# Patient Record
Sex: Female | Born: 1935
Health system: Southern US, Community
[De-identification: ages and names within clinical notes are randomized; demographics above are authoritative.]

## PROBLEM LIST (undated history)

## (undated) DIAGNOSIS — R55 Syncope and collapse: Secondary | ICD-10-CM

## (undated) DIAGNOSIS — E785 Hyperlipidemia, unspecified: Secondary | ICD-10-CM

## (undated) DIAGNOSIS — R42 Dizziness and giddiness: Secondary | ICD-10-CM

## (undated) DIAGNOSIS — M810 Age-related osteoporosis without current pathological fracture: Secondary | ICD-10-CM

## (undated) DIAGNOSIS — Z853 Personal history of malignant neoplasm of breast: Secondary | ICD-10-CM

## (undated) HISTORY — DX: Dizziness and giddiness: R42

## (undated) HISTORY — DX: Age-related osteoporosis without current pathological fracture: M81.0

## (undated) HISTORY — DX: Hyperlipidemia, unspecified: E78.5

## (undated) HISTORY — DX: Dizziness and giddiness: R55

## (undated) HISTORY — PX: TONSILLECTOMY: SUR1361

## (undated) HISTORY — PX: TUBAL LIGATION: SHX77

---

## 2004-07-20 ENCOUNTER — Ambulatory Visit: Payer: Self-pay

## 2005-08-16 ENCOUNTER — Ambulatory Visit: Payer: Self-pay

## 2006-09-19 ENCOUNTER — Ambulatory Visit: Payer: Self-pay | Admitting: Internal Medicine

## 2006-10-03 ENCOUNTER — Ambulatory Visit: Payer: Self-pay | Admitting: Internal Medicine

## 2007-07-23 ENCOUNTER — Encounter: Admission: RE | Admit: 2007-07-23 | Discharge: 2007-07-23 | Payer: Self-pay | Admitting: Internal Medicine

## 2007-07-26 ENCOUNTER — Ambulatory Visit: Payer: Self-pay | Admitting: Surgery

## 2008-03-24 ENCOUNTER — Emergency Department (HOSPITAL_COMMUNITY): Admission: EM | Admit: 2008-03-24 | Discharge: 2008-03-24 | Payer: Self-pay | Admitting: Emergency Medicine

## 2008-07-04 ENCOUNTER — Encounter: Admission: RE | Admit: 2008-07-04 | Discharge: 2008-07-04 | Payer: Self-pay | Admitting: Internal Medicine

## 2009-07-06 ENCOUNTER — Encounter: Admission: RE | Admit: 2009-07-06 | Discharge: 2009-07-06 | Payer: Self-pay | Admitting: Internal Medicine

## 2010-06-08 ENCOUNTER — Other Ambulatory Visit: Payer: Self-pay | Admitting: Internal Medicine

## 2010-06-08 DIAGNOSIS — Z1231 Encounter for screening mammogram for malignant neoplasm of breast: Secondary | ICD-10-CM

## 2010-06-08 LAB — DIFFERENTIAL
Eosinophils Absolute: 0 10*3/uL (ref 0.0–0.7)
Eosinophils Relative: 0 % (ref 0–5)
Monocytes Relative: 6 % (ref 3–12)
Neutro Abs: 10.2 10*3/uL — ABNORMAL HIGH (ref 1.7–7.7)

## 2010-06-08 LAB — CBC
RBC: 4.77 MIL/uL (ref 3.87–5.11)
RDW: 12.4 % (ref 11.5–15.5)
WBC: 12.1 10*3/uL — ABNORMAL HIGH (ref 4.0–10.5)

## 2010-06-08 LAB — BASIC METABOLIC PANEL
CO2: 25 mEq/L (ref 19–32)
Chloride: 106 mEq/L (ref 96–112)
GFR calc non Af Amer: 59 mL/min — ABNORMAL LOW (ref >60)
Glucose, Bld: 141 mg/dL — ABNORMAL HIGH (ref 70–99)
Sodium: 140 mEq/L (ref 135–145)

## 2010-07-08 ENCOUNTER — Ambulatory Visit
Admission: RE | Admit: 2010-07-08 | Discharge: 2010-07-08 | Disposition: A | Discharge status: Home / Self Care | Payer: Medicare Other | Source: Ambulatory Visit | Attending: Internal Medicine | Admitting: Internal Medicine

## 2010-07-08 DIAGNOSIS — Z1231 Encounter for screening mammogram for malignant neoplasm of breast: Secondary | ICD-10-CM

## 2011-05-30 ENCOUNTER — Other Ambulatory Visit: Payer: Self-pay | Admitting: Internal Medicine

## 2011-05-30 DIAGNOSIS — Z1231 Encounter for screening mammogram for malignant neoplasm of breast: Secondary | ICD-10-CM

## 2011-06-13 DIAGNOSIS — J069 Acute upper respiratory infection, unspecified: Secondary | ICD-10-CM | POA: Diagnosis not present

## 2011-07-11 ENCOUNTER — Ambulatory Visit
Admission: RE | Admit: 2011-07-11 | Discharge: 2011-07-11 | Disposition: A | Discharge status: Home / Self Care | Payer: Medicare Other | Source: Ambulatory Visit | Attending: Internal Medicine | Admitting: Internal Medicine

## 2011-07-11 DIAGNOSIS — Z1231 Encounter for screening mammogram for malignant neoplasm of breast: Secondary | ICD-10-CM

## 2011-07-27 DIAGNOSIS — B009 Herpesviral infection, unspecified: Secondary | ICD-10-CM | POA: Diagnosis not present

## 2011-07-27 DIAGNOSIS — L821 Other seborrheic keratosis: Secondary | ICD-10-CM | POA: Diagnosis not present

## 2011-08-11 DIAGNOSIS — E785 Hyperlipidemia, unspecified: Secondary | ICD-10-CM | POA: Diagnosis not present

## 2011-08-11 DIAGNOSIS — M1990 Unspecified osteoarthritis, unspecified site: Secondary | ICD-10-CM | POA: Diagnosis not present

## 2011-08-11 DIAGNOSIS — R7309 Other abnormal glucose: Secondary | ICD-10-CM | POA: Diagnosis not present

## 2011-08-11 DIAGNOSIS — R030 Elevated blood-pressure reading, without diagnosis of hypertension: Secondary | ICD-10-CM | POA: Diagnosis not present

## 2011-12-13 DIAGNOSIS — L568 Other specified acute skin changes due to ultraviolet radiation: Secondary | ICD-10-CM | POA: Diagnosis not present

## 2011-12-13 DIAGNOSIS — R209 Unspecified disturbances of skin sensation: Secondary | ICD-10-CM | POA: Diagnosis not present

## 2012-01-27 DIAGNOSIS — M899 Disorder of bone, unspecified: Secondary | ICD-10-CM | POA: Diagnosis not present

## 2012-01-27 DIAGNOSIS — R82998 Other abnormal findings in urine: Secondary | ICD-10-CM | POA: Diagnosis not present

## 2012-01-27 DIAGNOSIS — R7309 Other abnormal glucose: Secondary | ICD-10-CM | POA: Diagnosis not present

## 2012-01-27 DIAGNOSIS — E785 Hyperlipidemia, unspecified: Secondary | ICD-10-CM | POA: Diagnosis not present

## 2012-02-03 DIAGNOSIS — E785 Hyperlipidemia, unspecified: Secondary | ICD-10-CM | POA: Diagnosis not present

## 2012-02-03 DIAGNOSIS — M899 Disorder of bone, unspecified: Secondary | ICD-10-CM | POA: Diagnosis not present

## 2012-02-03 DIAGNOSIS — Z0000 Encounter for general adult medical examination without abnormal findings: Secondary | ICD-10-CM | POA: Diagnosis not present

## 2012-02-03 DIAGNOSIS — Z1212 Encounter for screening for malignant neoplasm of rectum: Secondary | ICD-10-CM | POA: Diagnosis not present

## 2012-02-03 DIAGNOSIS — M949 Disorder of cartilage, unspecified: Secondary | ICD-10-CM | POA: Diagnosis not present

## 2012-02-03 DIAGNOSIS — R7309 Other abnormal glucose: Secondary | ICD-10-CM | POA: Diagnosis not present

## 2012-05-15 DIAGNOSIS — M899 Disorder of bone, unspecified: Secondary | ICD-10-CM | POA: Diagnosis not present

## 2012-05-15 DIAGNOSIS — M949 Disorder of cartilage, unspecified: Secondary | ICD-10-CM | POA: Diagnosis not present

## 2012-06-12 ENCOUNTER — Other Ambulatory Visit: Payer: Self-pay

## 2012-06-12 DIAGNOSIS — Z1231 Encounter for screening mammogram for malignant neoplasm of breast: Secondary | ICD-10-CM

## 2012-06-21 DIAGNOSIS — M810 Age-related osteoporosis without current pathological fracture: Secondary | ICD-10-CM | POA: Diagnosis not present

## 2012-07-11 ENCOUNTER — Ambulatory Visit
Admission: RE | Admit: 2012-07-11 | Discharge: 2012-07-11 | Disposition: A | Discharge status: Home / Self Care | Payer: Medicare Other | Source: Ambulatory Visit

## 2012-07-11 DIAGNOSIS — Z1231 Encounter for screening mammogram for malignant neoplasm of breast: Secondary | ICD-10-CM

## 2012-07-26 ENCOUNTER — Other Ambulatory Visit: Payer: Self-pay | Admitting: Family Medicine

## 2012-07-27 ENCOUNTER — Ambulatory Visit
Admission: RE | Admit: 2012-07-27 | Discharge: 2012-07-27 | Disposition: A | Discharge status: Home / Self Care | Payer: No Typology Code available for payment source | Source: Ambulatory Visit | Attending: Family Medicine | Admitting: Family Medicine

## 2012-07-27 ENCOUNTER — Other Ambulatory Visit: Payer: Self-pay | Admitting: Family Medicine

## 2012-07-27 DIAGNOSIS — Z006 Encounter for examination for normal comparison and control in clinical research program: Secondary | ICD-10-CM

## 2012-08-06 DIAGNOSIS — R030 Elevated blood-pressure reading, without diagnosis of hypertension: Secondary | ICD-10-CM | POA: Diagnosis not present

## 2012-08-06 DIAGNOSIS — M810 Age-related osteoporosis without current pathological fracture: Secondary | ICD-10-CM | POA: Diagnosis not present

## 2012-08-06 DIAGNOSIS — E785 Hyperlipidemia, unspecified: Secondary | ICD-10-CM | POA: Diagnosis not present

## 2012-08-06 DIAGNOSIS — Z6825 Body mass index (BMI) 25.0-25.9, adult: Secondary | ICD-10-CM | POA: Diagnosis not present

## 2012-08-06 DIAGNOSIS — M1990 Unspecified osteoarthritis, unspecified site: Secondary | ICD-10-CM | POA: Diagnosis not present

## 2013-02-19 DIAGNOSIS — R7309 Other abnormal glucose: Secondary | ICD-10-CM | POA: Diagnosis not present

## 2013-02-19 DIAGNOSIS — E785 Hyperlipidemia, unspecified: Secondary | ICD-10-CM | POA: Diagnosis not present

## 2013-02-19 DIAGNOSIS — M810 Age-related osteoporosis without current pathological fracture: Secondary | ICD-10-CM | POA: Diagnosis not present

## 2013-02-27 DIAGNOSIS — Z1212 Encounter for screening for malignant neoplasm of rectum: Secondary | ICD-10-CM | POA: Diagnosis not present

## 2013-03-01 DIAGNOSIS — R030 Elevated blood-pressure reading, without diagnosis of hypertension: Secondary | ICD-10-CM | POA: Diagnosis not present

## 2013-03-01 DIAGNOSIS — M1990 Unspecified osteoarthritis, unspecified site: Secondary | ICD-10-CM | POA: Diagnosis not present

## 2013-03-01 DIAGNOSIS — R209 Unspecified disturbances of skin sensation: Secondary | ICD-10-CM | POA: Diagnosis not present

## 2013-03-01 DIAGNOSIS — E785 Hyperlipidemia, unspecified: Secondary | ICD-10-CM | POA: Diagnosis not present

## 2013-03-01 DIAGNOSIS — Z0000 Encounter for general adult medical examination without abnormal findings: Secondary | ICD-10-CM | POA: Diagnosis not present

## 2013-03-01 DIAGNOSIS — Z1331 Encounter for screening for depression: Secondary | ICD-10-CM | POA: Diagnosis not present

## 2013-03-01 DIAGNOSIS — M810 Age-related osteoporosis without current pathological fracture: Secondary | ICD-10-CM | POA: Diagnosis not present

## 2013-03-01 DIAGNOSIS — R7309 Other abnormal glucose: Secondary | ICD-10-CM | POA: Diagnosis not present

## 2013-06-04 ENCOUNTER — Other Ambulatory Visit: Payer: Self-pay

## 2013-06-04 DIAGNOSIS — Z1231 Encounter for screening mammogram for malignant neoplasm of breast: Secondary | ICD-10-CM

## 2013-07-25 ENCOUNTER — Encounter (INDEPENDENT_AMBULATORY_CARE_PROVIDER_SITE_OTHER): Payer: Self-pay

## 2013-07-25 ENCOUNTER — Ambulatory Visit
Admission: RE | Admit: 2013-07-25 | Discharge: 2013-07-25 | Disposition: A | Discharge status: Home / Self Care | Payer: Medicare Other | Source: Ambulatory Visit

## 2013-07-25 DIAGNOSIS — Z1231 Encounter for screening mammogram for malignant neoplasm of breast: Secondary | ICD-10-CM | POA: Diagnosis not present

## 2013-08-20 ENCOUNTER — Ambulatory Visit
Admission: RE | Admit: 2013-08-20 | Discharge: 2013-08-20 | Disposition: A | Discharge status: Home / Self Care | Payer: Self-pay | Source: Ambulatory Visit | Attending: Family Medicine | Admitting: Family Medicine

## 2013-08-20 ENCOUNTER — Other Ambulatory Visit: Payer: Self-pay | Admitting: Family Medicine

## 2013-08-20 DIAGNOSIS — Z006 Encounter for examination for normal comparison and control in clinical research program: Secondary | ICD-10-CM

## 2014-02-24 DIAGNOSIS — M810 Age-related osteoporosis without current pathological fracture: Secondary | ICD-10-CM | POA: Diagnosis not present

## 2014-02-24 DIAGNOSIS — R739 Hyperglycemia, unspecified: Secondary | ICD-10-CM | POA: Diagnosis not present

## 2014-02-24 DIAGNOSIS — R8299 Other abnormal findings in urine: Secondary | ICD-10-CM | POA: Diagnosis not present

## 2014-02-24 DIAGNOSIS — Z008 Encounter for other general examination: Secondary | ICD-10-CM | POA: Diagnosis not present

## 2014-02-24 DIAGNOSIS — E785 Hyperlipidemia, unspecified: Secondary | ICD-10-CM | POA: Diagnosis not present

## 2014-03-03 DIAGNOSIS — R739 Hyperglycemia, unspecified: Secondary | ICD-10-CM | POA: Diagnosis not present

## 2014-03-03 DIAGNOSIS — Z1389 Encounter for screening for other disorder: Secondary | ICD-10-CM | POA: Diagnosis not present

## 2014-03-03 DIAGNOSIS — E785 Hyperlipidemia, unspecified: Secondary | ICD-10-CM | POA: Diagnosis not present

## 2014-03-03 DIAGNOSIS — Z0000 Encounter for general adult medical examination without abnormal findings: Secondary | ICD-10-CM | POA: Diagnosis not present

## 2014-03-03 DIAGNOSIS — M1990 Unspecified osteoarthritis, unspecified site: Secondary | ICD-10-CM | POA: Diagnosis not present

## 2014-03-03 DIAGNOSIS — R030 Elevated blood-pressure reading, without diagnosis of hypertension: Secondary | ICD-10-CM | POA: Diagnosis not present

## 2014-03-03 DIAGNOSIS — M810 Age-related osteoporosis without current pathological fracture: Secondary | ICD-10-CM | POA: Diagnosis not present

## 2014-03-03 DIAGNOSIS — Z6825 Body mass index (BMI) 25.0-25.9, adult: Secondary | ICD-10-CM | POA: Diagnosis not present

## 2014-03-04 DIAGNOSIS — Z1212 Encounter for screening for malignant neoplasm of rectum: Secondary | ICD-10-CM | POA: Diagnosis not present

## 2014-06-23 ENCOUNTER — Other Ambulatory Visit: Payer: Self-pay

## 2014-06-23 DIAGNOSIS — Z1231 Encounter for screening mammogram for malignant neoplasm of breast: Secondary | ICD-10-CM

## 2014-08-04 ENCOUNTER — Ambulatory Visit
Admission: RE | Admit: 2014-08-04 | Discharge: 2014-08-04 | Disposition: A | Discharge status: Home / Self Care | Payer: Medicare Other | Source: Ambulatory Visit

## 2014-08-04 DIAGNOSIS — Z1231 Encounter for screening mammogram for malignant neoplasm of breast: Secondary | ICD-10-CM | POA: Diagnosis not present

## 2014-08-05 ENCOUNTER — Other Ambulatory Visit: Payer: Self-pay | Admitting: Internal Medicine

## 2014-08-05 DIAGNOSIS — M810 Age-related osteoporosis without current pathological fracture: Secondary | ICD-10-CM

## 2014-08-12 ENCOUNTER — Ambulatory Visit
Admission: RE | Admit: 2014-08-12 | Discharge: 2014-08-12 | Disposition: A | Discharge status: Home / Self Care | Payer: No Typology Code available for payment source | Source: Ambulatory Visit | Attending: Internal Medicine | Admitting: Internal Medicine

## 2014-08-12 DIAGNOSIS — M810 Age-related osteoporosis without current pathological fracture: Secondary | ICD-10-CM

## 2015-03-10 DIAGNOSIS — M810 Age-related osteoporosis without current pathological fracture: Secondary | ICD-10-CM | POA: Diagnosis not present

## 2015-03-10 DIAGNOSIS — R739 Hyperglycemia, unspecified: Secondary | ICD-10-CM | POA: Diagnosis not present

## 2015-03-10 DIAGNOSIS — E784 Other hyperlipidemia: Secondary | ICD-10-CM | POA: Diagnosis not present

## 2015-03-10 DIAGNOSIS — R030 Elevated blood-pressure reading, without diagnosis of hypertension: Secondary | ICD-10-CM | POA: Diagnosis not present

## 2015-03-10 DIAGNOSIS — R8290 Unspecified abnormal findings in urine: Secondary | ICD-10-CM | POA: Diagnosis not present

## 2015-03-10 DIAGNOSIS — N390 Urinary tract infection, site not specified: Secondary | ICD-10-CM | POA: Diagnosis not present

## 2015-03-17 DIAGNOSIS — M1990 Unspecified osteoarthritis, unspecified site: Secondary | ICD-10-CM | POA: Diagnosis not present

## 2015-03-17 DIAGNOSIS — M810 Age-related osteoporosis without current pathological fracture: Secondary | ICD-10-CM | POA: Diagnosis not present

## 2015-03-17 DIAGNOSIS — R030 Elevated blood-pressure reading, without diagnosis of hypertension: Secondary | ICD-10-CM | POA: Diagnosis not present

## 2015-03-17 DIAGNOSIS — Z0000 Encounter for general adult medical examination without abnormal findings: Secondary | ICD-10-CM | POA: Diagnosis not present

## 2015-03-17 DIAGNOSIS — R739 Hyperglycemia, unspecified: Secondary | ICD-10-CM | POA: Diagnosis not present

## 2015-03-17 DIAGNOSIS — E784 Other hyperlipidemia: Secondary | ICD-10-CM | POA: Diagnosis not present

## 2015-03-17 DIAGNOSIS — Z6825 Body mass index (BMI) 25.0-25.9, adult: Secondary | ICD-10-CM | POA: Diagnosis not present

## 2015-03-17 DIAGNOSIS — Z1389 Encounter for screening for other disorder: Secondary | ICD-10-CM | POA: Diagnosis not present

## 2015-03-18 DIAGNOSIS — Z1212 Encounter for screening for malignant neoplasm of rectum: Secondary | ICD-10-CM | POA: Diagnosis not present

## 2015-03-20 DIAGNOSIS — M810 Age-related osteoporosis without current pathological fracture: Secondary | ICD-10-CM | POA: Diagnosis not present

## 2015-05-06 DIAGNOSIS — M810 Age-related osteoporosis without current pathological fracture: Secondary | ICD-10-CM | POA: Diagnosis not present

## 2015-05-06 DIAGNOSIS — Z6825 Body mass index (BMI) 25.0-25.9, adult: Secondary | ICD-10-CM | POA: Diagnosis not present

## 2015-05-15 ENCOUNTER — Encounter (HOSPITAL_COMMUNITY): Payer: No Typology Code available for payment source

## 2015-05-15 ENCOUNTER — Ambulatory Visit (HOSPITAL_COMMUNITY)
Admission: RE | Admit: 2015-05-15 | Discharge: 2015-05-15 | Disposition: A | Discharge status: Home / Self Care | Payer: Medicare Other | Source: Ambulatory Visit | Attending: Internal Medicine | Admitting: Internal Medicine

## 2015-05-15 DIAGNOSIS — M810 Age-related osteoporosis without current pathological fracture: Secondary | ICD-10-CM | POA: Diagnosis not present

## 2015-05-15 MED ORDER — DENOSUMAB 60 MG/ML ~~LOC~~ SOLN
60.0000 mg | Freq: Once | SUBCUTANEOUS | Status: AC
  Administered 2015-05-15: 60 mg via SUBCUTANEOUS
  Filled 2015-05-15: qty 1

## 2015-05-15 NOTE — Discharge Instructions (Signed)
Denosumab injection  What is this medicine?  DENOSUMAB (den oh sue mab) slows bone breakdown. Prolia is used to treat osteoporosis in women after menopause and in men. Xgeva is used to prevent bone fractures and other bone problems caused by cancer bone metastases. Xgeva is also used to treat giant cell tumor of the bone.  This medicine may be used for other purposes; ask your health care provider or pharmacist if you have questions.  What should I tell my health care provider before I take this medicine?  They need to know if you have any of these conditions:  -dental disease  -eczema  -infection or history of infections  -kidney disease or on dialysis  -low blood calcium or vitamin D  -malabsorption syndrome  -scheduled to have surgery or tooth extraction  -taking medicine that contains denosumab  -thyroid or parathyroid disease  -an unusual reaction to denosumab, other medicines, foods, dyes, or preservatives  -pregnant or trying to get pregnant  -breast-feeding  How should I use this medicine?  This medicine is for injection under the skin. It is given by a health care professional in a hospital or clinic setting.  If you are getting Prolia, a special MedGuide will be given to you by the pharmacist with each prescription and refill. Be sure to read this information carefully each time.  For Prolia, talk to your pediatrician regarding the use of this medicine in children. Special care may be needed. For Xgeva, talk to your pediatrician regarding the use of this medicine in children. While this drug may be prescribed for children as young as 13 years for selected conditions, precautions do apply.  Overdosage: If you think you have taken too much of this medicine contact a poison control center or emergency room at once.  NOTE: This medicine is only for you. Do not share this medicine with others.  What if I miss a dose?  It is important not to miss your dose. Call your doctor or health care professional if you are  unable to keep an appointment.  What may interact with this medicine?  Do not take this medicine with any of the following medications:  -other medicines containing denosumab  This medicine may also interact with the following medications:  -medicines that suppress the immune system  -medicines that treat cancer  -steroid medicines like prednisone or cortisone  This list may not describe all possible interactions. Give your health care provider a list of all the medicines, herbs, non-prescription drugs, or dietary supplements you use. Also tell them if you smoke, drink alcohol, or use illegal drugs. Some items may interact with your medicine.  What should I watch for while using this medicine?  Visit your doctor or health care professional for regular checks on your progress. Your doctor or health care professional may order blood tests and other tests to see how you are doing.  Call your doctor or health care professional if you get a cold or other infection while receiving this medicine. Do not treat yourself. This medicine may decrease your body's ability to fight infection.  You should make sure you get enough calcium and vitamin D while you are taking this medicine, unless your doctor tells you not to. Discuss the foods you eat and the vitamins you take with your health care professional.  See your dentist regularly. Brush and floss your teeth as directed. Before you have any dental work done, tell your dentist you are receiving this medicine.  Do   not become pregnant while taking this medicine or for 5 months after stopping it. Women should inform their doctor if they wish to become pregnant or think they might be pregnant. There is a potential for serious side effects to an unborn child. Talk to your health care professional or pharmacist for more information.  What side effects may I notice from receiving this medicine?  Side effects that you should report to your doctor or health care professional as soon as  possible:  -allergic reactions like skin rash, itching or hives, swelling of the face, lips, or tongue  -breathing problems  -chest pain  -fast, irregular heartbeat  -feeling faint or lightheaded, falls  -fever, chills, or any other sign of infection  -muscle spasms, tightening, or twitches  -numbness or tingling  -skin blisters or bumps, or is dry, peels, or red  -slow healing or unexplained pain in the mouth or jaw  -unusual bleeding or bruising  Side effects that usually do not require medical attention (Report these to your doctor or health care professional if they continue or are bothersome.):  -muscle pain  -stomach upset, gas  This list may not describe all possible side effects. Call your doctor for medical advice about side effects. You may report side effects to FDA at 1-800-FDA-1088.  Where should I keep my medicine?  This medicine is only given in a clinic, doctor's office, or other health care setting and will not be stored at home.  NOTE: This sheet is a summary. It may not cover all possible information. If you have questions about this medicine, talk to your doctor, pharmacist, or health care provider.      2016, [employer] Standard. (2011-08-08 12:37:47)

## 2015-06-25 ENCOUNTER — Other Ambulatory Visit: Payer: Self-pay

## 2015-06-25 DIAGNOSIS — Z1231 Encounter for screening mammogram for malignant neoplasm of breast: Secondary | ICD-10-CM

## 2015-08-06 ENCOUNTER — Ambulatory Visit
Admission: RE | Admit: 2015-08-06 | Discharge: 2015-08-06 | Disposition: A | Discharge status: Home / Self Care | Payer: Medicare Other | Source: Ambulatory Visit

## 2015-08-06 DIAGNOSIS — Z1231 Encounter for screening mammogram for malignant neoplasm of breast: Secondary | ICD-10-CM

## 2015-08-07 ENCOUNTER — Other Ambulatory Visit: Payer: Self-pay | Admitting: Internal Medicine

## 2015-08-07 DIAGNOSIS — R928 Other abnormal and inconclusive findings on diagnostic imaging of breast: Secondary | ICD-10-CM

## 2015-08-20 ENCOUNTER — Ambulatory Visit
Admission: RE | Admit: 2015-08-20 | Discharge: 2015-08-20 | Disposition: A | Discharge status: Home / Self Care | Payer: Medicare Other | Source: Ambulatory Visit | Attending: Internal Medicine | Admitting: Internal Medicine

## 2015-08-20 DIAGNOSIS — R928 Other abnormal and inconclusive findings on diagnostic imaging of breast: Secondary | ICD-10-CM

## 2016-03-15 DIAGNOSIS — R7309 Other abnormal glucose: Secondary | ICD-10-CM | POA: Diagnosis not present

## 2016-03-15 DIAGNOSIS — E784 Other hyperlipidemia: Secondary | ICD-10-CM | POA: Diagnosis not present

## 2016-03-15 DIAGNOSIS — R8299 Other abnormal findings in urine: Secondary | ICD-10-CM | POA: Diagnosis not present

## 2016-03-15 DIAGNOSIS — M810 Age-related osteoporosis without current pathological fracture: Secondary | ICD-10-CM | POA: Diagnosis not present

## 2016-03-22 DIAGNOSIS — M810 Age-related osteoporosis without current pathological fracture: Secondary | ICD-10-CM | POA: Diagnosis not present

## 2016-03-22 DIAGNOSIS — Z1389 Encounter for screening for other disorder: Secondary | ICD-10-CM | POA: Diagnosis not present

## 2016-03-22 DIAGNOSIS — M1990 Unspecified osteoarthritis, unspecified site: Secondary | ICD-10-CM | POA: Diagnosis not present

## 2016-03-22 DIAGNOSIS — R7309 Other abnormal glucose: Secondary | ICD-10-CM | POA: Diagnosis not present

## 2016-03-22 DIAGNOSIS — Z0000 Encounter for general adult medical examination without abnormal findings: Secondary | ICD-10-CM | POA: Diagnosis not present

## 2016-03-22 DIAGNOSIS — R208 Other disturbances of skin sensation: Secondary | ICD-10-CM | POA: Diagnosis not present

## 2016-03-22 DIAGNOSIS — R030 Elevated blood-pressure reading, without diagnosis of hypertension: Secondary | ICD-10-CM | POA: Diagnosis not present

## 2016-03-22 DIAGNOSIS — Z6824 Body mass index (BMI) 24.0-24.9, adult: Secondary | ICD-10-CM | POA: Diagnosis not present

## 2016-03-22 DIAGNOSIS — E784 Other hyperlipidemia: Secondary | ICD-10-CM | POA: Diagnosis not present

## 2016-04-15 DIAGNOSIS — Z1212 Encounter for screening for malignant neoplasm of rectum: Secondary | ICD-10-CM | POA: Diagnosis not present

## 2016-06-27 ENCOUNTER — Other Ambulatory Visit (HOSPITAL_COMMUNITY): Payer: Self-pay | Admitting: *Deleted

## 2016-06-28 ENCOUNTER — Ambulatory Visit (HOSPITAL_COMMUNITY)
Admission: RE | Admit: 2016-06-28 | Discharge: 2016-06-28 | Disposition: A | Discharge status: Home / Self Care | Payer: Medicare Other | Source: Ambulatory Visit | Attending: Internal Medicine | Admitting: Internal Medicine

## 2016-06-28 DIAGNOSIS — M810 Age-related osteoporosis without current pathological fracture: Secondary | ICD-10-CM | POA: Diagnosis not present

## 2016-06-28 MED ORDER — DENOSUMAB 60 MG/ML ~~LOC~~ SOLN
60.0000 mg | Freq: Once | SUBCUTANEOUS | Status: AC
  Administered 2016-06-28: 60 mg via SUBCUTANEOUS
  Filled 2016-06-28: qty 1

## 2016-07-11 ENCOUNTER — Other Ambulatory Visit: Payer: Self-pay | Admitting: Internal Medicine

## 2016-07-11 DIAGNOSIS — Z1231 Encounter for screening mammogram for malignant neoplasm of breast: Secondary | ICD-10-CM

## 2016-08-22 ENCOUNTER — Ambulatory Visit: Payer: Medicare Other

## 2016-09-05 ENCOUNTER — Ambulatory Visit
Admission: RE | Admit: 2016-09-05 | Discharge: 2016-09-05 | Disposition: A | Discharge status: Home / Self Care | Payer: Medicare Other | Source: Ambulatory Visit | Attending: Internal Medicine | Admitting: Internal Medicine

## 2016-09-05 DIAGNOSIS — Z1231 Encounter for screening mammogram for malignant neoplasm of breast: Secondary | ICD-10-CM | POA: Diagnosis not present

## 2016-11-04 ENCOUNTER — Encounter: Payer: Self-pay | Admitting: Gastroenterology

## 2016-12-14 ENCOUNTER — Ambulatory Visit (AMBULATORY_SURGERY_CENTER): Payer: Self-pay

## 2016-12-14 VITALS — Ht 61.5 in | Wt 135.0 lb

## 2016-12-14 DIAGNOSIS — Z1211 Encounter for screening for malignant neoplasm of colon: Secondary | ICD-10-CM

## 2016-12-14 MED ORDER — SUPREP BOWEL PREP KIT 17.5-3.13-1.6 GM/177ML PO SOLN
1.0000 | Freq: Once | ORAL | 0 refills | Status: AC
Start: 2016-12-14 — End: 2016-12-14

## 2016-12-14 NOTE — Progress Notes (Signed)
No allergies to eggs or soy No past problems with anesthesia No diet meds No home oxygen  Declined emmi 

## 2016-12-30 ENCOUNTER — Encounter: Payer: Medicare Other | Admitting: Gastroenterology

## 2017-01-05 ENCOUNTER — Other Ambulatory Visit: Payer: Self-pay

## 2017-01-05 ENCOUNTER — Ambulatory Visit (AMBULATORY_SURGERY_CENTER): Payer: Medicare Other | Admitting: Gastroenterology

## 2017-01-05 ENCOUNTER — Encounter: Payer: Self-pay | Admitting: Gastroenterology

## 2017-01-05 VITALS — BP 114/62 | HR 79 | Temp 97.5°F | Resp 20 | Ht 61.5 in | Wt 135.0 lb

## 2017-01-05 DIAGNOSIS — D123 Benign neoplasm of transverse colon: Secondary | ICD-10-CM

## 2017-01-05 DIAGNOSIS — D120 Benign neoplasm of cecum: Secondary | ICD-10-CM

## 2017-01-05 DIAGNOSIS — Z86010 Personal history of colonic polyps: Secondary | ICD-10-CM | POA: Diagnosis not present

## 2017-01-05 DIAGNOSIS — Z1211 Encounter for screening for malignant neoplasm of colon: Secondary | ICD-10-CM | POA: Diagnosis not present

## 2017-01-05 MED ORDER — SODIUM CHLORIDE 0.9 % IV SOLN: 500.0000 mL | INTRAVENOUS | Status: DC

## 2017-01-05 NOTE — Progress Notes (Signed)
Pt's states no medical or surgical changes since previsit or office visit. 

## 2017-01-05 NOTE — Progress Notes (Signed)
Report given to PACU, vss 

## 2017-01-05 NOTE — Patient Instructions (Signed)
YOU HAD AN ENDOSCOPIC PROCEDURE TODAY AT THE [facility]:   Refer to the procedure report that was given to you for any specific questions about what was found during the examination.  If the procedure report does not answer your questions, please call your gastroenterologist to clarify.  If you requested that your care partner not be given the details of your procedure findings, then the procedure report has been included in a sealed envelope for you to review at your convenience later.  YOU SHOULD EXPECT: Some feelings of bloating in the abdomen. Passage of more gas than usual.  Walking can help get rid of the air that was put into your GI tract during the procedure and reduce the bloating. If you had a lower endoscopy (such as a colonoscopy or flexible sigmoidoscopy) you may notice spotting of blood in your stool or on the toilet paper. If you underwent a bowel prep for your procedure, you may not have a normal bowel movement for a few days.  Please Note:  You might notice some irritation and congestion in your nose or some drainage.  This is from the oxygen used during your procedure.  There is no need for concern and it should clear up in a day or so.  SYMPTOMS TO REPORT IMMEDIATELY:   Following lower endoscopy (colonoscopy or flexible sigmoidoscopy):  Excessive amounts of blood in the stool  Significant tenderness or worsening of abdominal pains  Swelling of the abdomen that is new, acute  Fever of 100F or higher  For urgent or emergent issues, a gastroenterologist can be reached at any hour by calling [phone].   DIET:  We do recommend a small meal at first, but then you may proceed to your regular diet.  Drink plenty of fluids but you should avoid alcoholic beverages for 24 hours.  ACTIVITY:  You should plan to take it easy for the rest of today and you should NOT DRIVE or use heavy machinery until tomorrow (because of the sedation medicines used during the test).     FOLLOW UP: Our staff will call the number listed on your records the next business day following your procedure to check on you and address any questions or concerns that you may have regarding the information given to you following your procedure. If we do not reach you, we will leave a message.  However, if you are feeling well and you are not experiencing any problems, there is no need to return our call.  We will assume that you have returned to your regular daily activities without incident.  If any biopsies were taken you will be contacted by phone or by letter within the next 1-3 weeks.  Please call us at [phone] if you have not heard about the biopsies in 3 weeks.   Await for biopsy results to determine next repeat Colonoscopy screening Polyps (handout given) Diverticulosis (handout given)   SIGNATURES/CONFIDENTIALITY: You and/or your care partner have signed paperwork which will be entered into your electronic medical record.  These signatures attest to the fact that that the information above on your After Visit Summary has been reviewed and is understood.  Full responsibility of the confidentiality of this discharge information lies with you and/or your care-partner. 

## 2017-01-05 NOTE — Progress Notes (Signed)
Called to room to assist during endoscopic procedure.  Patient ID and intended procedure confirmed with present staff. Received instructions for my participation in the procedure from the performing physician.  

## 2017-01-05 NOTE — Op Note (Signed)
[facility] Patient Name: Holly Leon Procedure Date: 01/05/2017 8:52 AM MRN: [id number] Endoscopist: Remo Lipps P. Armbruster MD, MD Age: 81 Referring MD:  Date of Birth: 1935-07-28 Gender: Female Account #: [id number] Procedure:                Colonoscopy Indications:              Screening for colorectal malignant neoplasm Medicines:                Monitored Anesthesia Care Procedure:                Pre-Anesthesia Assessment:                           - Prior to the procedure, a History and Physical                            was performed, and patient medications and                            allergies were reviewed. The patient's tolerance of                            previous anesthesia was also reviewed. The risks                            and benefits of the procedure and the sedation                            options and risks were discussed with the patient.                            All questions were answered, and informed consent                            was obtained. Prior Anticoagulants: The patient has                            taken no previous anticoagulant or antiplatelet                            agents. ASA Grade Assessment: I - A normal, healthy                            patient. After reviewing the risks and benefits,                            the patient was deemed in satisfactory condition to                            undergo the procedure.                           After obtaining informed consent, the colonoscope  was passed under direct vision. Throughout the                            procedure, the patient's blood pressure, pulse, and                            oxygen saturations were monitored continuously. The                            Model PCF-[id number]) scope was introduced                            through the anus and advanced to the the cecum,                            identified by  appendiceal orifice and ileocecal                            valve. The colonoscopy was performed without                            difficulty. The patient tolerated the procedure                            well. The quality of the bowel preparation was                            good. The ileocecal valve, appendiceal orifice, and                            rectum were photographed. Scope In: 9:01:19 AM Scope Out: 9:19:03 AM Scope Withdrawal Time: 0 hours 14 minutes 43 seconds  Total Procedure Duration: 0 hours 17 minutes 44 seconds  Findings:                 The perianal and digital rectal examinations were                            normal.                           Two sessile polyps were found in the cecum. The                            polyps were 3 to 4 mm in size. These polyps were                            removed with a cold snare. Resection and retrieval                            were complete.                           A 3 mm polyp was found in the transverse colon. The  polyp was sessile. The polyp was removed with a                            cold snare. Resection and retrieval were complete.                           A few medium-mouthed diverticula were found in the                            sigmoid colon.                           The exam was otherwise without abnormality. Complications:            No immediate complications. Estimated blood loss:                            Minimal. Estimated Blood Loss:     Estimated blood loss was minimal. Impression:               - Two 3 to 4 mm polyps in the cecum, removed with a                            cold snare. Resected and retrieved.                           - One 3 mm polyp in the transverse colon, removed                            with a cold snare. Resected and retrieved.                           - Diverticulosis in the sigmoid colon.                           - The examination was  otherwise normal. Recommendation:           - Patient has a contact number available for                            emergencies. The signs and symptoms of potential                            delayed complications were discussed with the                            patient. Return to normal activities tomorrow.                            Written discharge instructions were provided to the                            patient.                           - Resume  previous diet.                           - Continue present medications.                           - Await pathology results with further                            recommendations. Remo Lipps P. Armbruster MD, MD 01/05/2017 9:22:27 AM This report has been signed electronically.

## 2017-01-06 ENCOUNTER — Telehealth: Payer: Self-pay | Admitting: *Deleted

## 2017-01-06 NOTE — Telephone Encounter (Signed)
  Follow up Call-  Call back number 01/05/2017  Post procedure Call Back phone  # [phone]  Permission to leave phone message Yes  Some recent data might be hidden     Patient questions:  Do you have a fever, pain , or abdominal swelling? No. Pain Score  0 *  Have you tolerated food without any problems? Yes.    Have you been able to return to your normal activities? No.  Do you have any questions about your discharge instructions: Diet   No. Medications  No. Follow up visit  No.  Do you have questions or concerns about your Care? No.  Actions: * If pain score is 4 or above: No action needed, pain <4.

## 2017-01-11 ENCOUNTER — Encounter: Payer: Self-pay | Admitting: Gastroenterology

## 2017-03-20 DIAGNOSIS — M810 Age-related osteoporosis without current pathological fracture: Secondary | ICD-10-CM | POA: Diagnosis not present

## 2017-03-20 DIAGNOSIS — R82998 Other abnormal findings in urine: Secondary | ICD-10-CM | POA: Diagnosis not present

## 2017-03-20 DIAGNOSIS — E7849 Other hyperlipidemia: Secondary | ICD-10-CM | POA: Diagnosis not present

## 2017-03-27 DIAGNOSIS — Z1212 Encounter for screening for malignant neoplasm of rectum: Secondary | ICD-10-CM | POA: Diagnosis not present

## 2017-03-27 DIAGNOSIS — R7309 Other abnormal glucose: Secondary | ICD-10-CM | POA: Diagnosis not present

## 2017-03-27 DIAGNOSIS — Z1389 Encounter for screening for other disorder: Secondary | ICD-10-CM | POA: Diagnosis not present

## 2017-03-27 DIAGNOSIS — Z0000 Encounter for general adult medical examination without abnormal findings: Secondary | ICD-10-CM | POA: Diagnosis not present

## 2017-03-27 DIAGNOSIS — E7849 Other hyperlipidemia: Secondary | ICD-10-CM | POA: Diagnosis not present

## 2017-03-27 DIAGNOSIS — Z6824 Body mass index (BMI) 24.0-24.9, adult: Secondary | ICD-10-CM | POA: Diagnosis not present

## 2017-03-27 DIAGNOSIS — M4302 Spondylolysis, cervical region: Secondary | ICD-10-CM | POA: Diagnosis not present

## 2017-03-27 DIAGNOSIS — M542 Cervicalgia: Secondary | ICD-10-CM | POA: Diagnosis not present

## 2017-03-27 DIAGNOSIS — R030 Elevated blood-pressure reading, without diagnosis of hypertension: Secondary | ICD-10-CM | POA: Diagnosis not present

## 2017-03-27 DIAGNOSIS — M810 Age-related osteoporosis without current pathological fracture: Secondary | ICD-10-CM | POA: Diagnosis not present

## 2017-04-07 ENCOUNTER — Other Ambulatory Visit (HOSPITAL_COMMUNITY): Payer: Self-pay

## 2017-04-10 ENCOUNTER — Ambulatory Visit (HOSPITAL_COMMUNITY)
Admission: RE | Admit: 2017-04-10 | Discharge: 2017-04-10 | Disposition: A | Discharge status: Home / Self Care | Payer: Medicare Other | Source: Ambulatory Visit | Attending: Internal Medicine | Admitting: Internal Medicine

## 2017-04-10 DIAGNOSIS — M810 Age-related osteoporosis without current pathological fracture: Secondary | ICD-10-CM | POA: Diagnosis not present

## 2017-04-10 MED ORDER — DENOSUMAB 60 MG/ML ~~LOC~~ SOLN
60.0000 mg | Freq: Once | SUBCUTANEOUS | Status: AC
  Administered 2017-04-10: 60 mg via SUBCUTANEOUS
  Filled 2017-04-10: qty 1

## 2017-04-11 ENCOUNTER — Encounter (HOSPITAL_COMMUNITY): Payer: Medicare Other

## 2017-07-20 DIAGNOSIS — M79662 Pain in left lower leg: Secondary | ICD-10-CM | POA: Diagnosis not present

## 2017-07-20 DIAGNOSIS — M25562 Pain in left knee: Secondary | ICD-10-CM | POA: Diagnosis not present

## 2017-07-20 DIAGNOSIS — R590 Localized enlarged lymph nodes: Secondary | ICD-10-CM | POA: Diagnosis not present

## 2017-07-20 DIAGNOSIS — M25462 Effusion, left knee: Secondary | ICD-10-CM | POA: Diagnosis not present

## 2017-07-31 ENCOUNTER — Other Ambulatory Visit: Payer: Self-pay | Admitting: Internal Medicine

## 2017-07-31 DIAGNOSIS — Z1231 Encounter for screening mammogram for malignant neoplasm of breast: Secondary | ICD-10-CM

## 2017-09-07 ENCOUNTER — Ambulatory Visit
Admission: RE | Admit: 2017-09-07 | Discharge: 2017-09-07 | Disposition: A | Discharge status: Home / Self Care | Payer: Medicare Other | Source: Ambulatory Visit | Attending: Internal Medicine | Admitting: Internal Medicine

## 2017-09-07 DIAGNOSIS — Z1231 Encounter for screening mammogram for malignant neoplasm of breast: Secondary | ICD-10-CM

## 2017-09-25 DIAGNOSIS — M810 Age-related osteoporosis without current pathological fracture: Secondary | ICD-10-CM | POA: Diagnosis not present

## 2017-09-25 DIAGNOSIS — E7849 Other hyperlipidemia: Secondary | ICD-10-CM | POA: Diagnosis not present

## 2018-03-26 DIAGNOSIS — E7849 Other hyperlipidemia: Secondary | ICD-10-CM | POA: Diagnosis not present

## 2018-03-26 DIAGNOSIS — R7309 Other abnormal glucose: Secondary | ICD-10-CM | POA: Diagnosis not present

## 2018-03-26 DIAGNOSIS — R82998 Other abnormal findings in urine: Secondary | ICD-10-CM | POA: Diagnosis not present

## 2018-03-26 DIAGNOSIS — M810 Age-related osteoporosis without current pathological fracture: Secondary | ICD-10-CM | POA: Diagnosis not present

## 2018-04-02 DIAGNOSIS — Z1339 Encounter for screening examination for other mental health and behavioral disorders: Secondary | ICD-10-CM | POA: Diagnosis not present

## 2018-04-02 DIAGNOSIS — R030 Elevated blood-pressure reading, without diagnosis of hypertension: Secondary | ICD-10-CM | POA: Diagnosis not present

## 2018-04-02 DIAGNOSIS — Z0000 Encounter for general adult medical examination without abnormal findings: Secondary | ICD-10-CM | POA: Diagnosis not present

## 2018-04-02 DIAGNOSIS — Z6825 Body mass index (BMI) 25.0-25.9, adult: Secondary | ICD-10-CM | POA: Diagnosis not present

## 2018-04-02 DIAGNOSIS — R7309 Other abnormal glucose: Secondary | ICD-10-CM | POA: Diagnosis not present

## 2018-04-02 DIAGNOSIS — E7849 Other hyperlipidemia: Secondary | ICD-10-CM | POA: Diagnosis not present

## 2018-04-02 DIAGNOSIS — M810 Age-related osteoporosis without current pathological fracture: Secondary | ICD-10-CM | POA: Diagnosis not present

## 2018-04-02 DIAGNOSIS — M1990 Unspecified osteoarthritis, unspecified site: Secondary | ICD-10-CM | POA: Diagnosis not present

## 2018-04-02 DIAGNOSIS — Z1331 Encounter for screening for depression: Secondary | ICD-10-CM | POA: Diagnosis not present

## 2018-04-04 DIAGNOSIS — Z1212 Encounter for screening for malignant neoplasm of rectum: Secondary | ICD-10-CM | POA: Diagnosis not present

## 2018-04-12 ENCOUNTER — Encounter (HOSPITAL_COMMUNITY): Payer: Medicare Other

## 2018-04-12 ENCOUNTER — Other Ambulatory Visit (HOSPITAL_COMMUNITY): Payer: Self-pay | Admitting: *Deleted

## 2018-04-13 ENCOUNTER — Ambulatory Visit (HOSPITAL_COMMUNITY)
Admission: RE | Admit: 2018-04-13 | Discharge: 2018-04-13 | Disposition: A | Discharge status: Home / Self Care | Payer: Medicare Other | Source: Ambulatory Visit | Attending: Internal Medicine | Admitting: Internal Medicine

## 2018-04-13 DIAGNOSIS — M810 Age-related osteoporosis without current pathological fracture: Secondary | ICD-10-CM | POA: Diagnosis not present

## 2018-04-13 MED ORDER — DENOSUMAB 60 MG/ML ~~LOC~~ SOSY
PREFILLED_SYRINGE | SUBCUTANEOUS | Status: AC
  Filled 2018-04-13: qty 1

## 2018-04-13 MED ORDER — DENOSUMAB 60 MG/ML ~~LOC~~ SOSY
60.0000 mg | PREFILLED_SYRINGE | Freq: Once | SUBCUTANEOUS | Status: AC
  Administered 2018-04-13: 60 mg via SUBCUTANEOUS

## 2018-07-30 ENCOUNTER — Other Ambulatory Visit: Payer: Self-pay | Admitting: Internal Medicine

## 2018-07-30 DIAGNOSIS — Z1231 Encounter for screening mammogram for malignant neoplasm of breast: Secondary | ICD-10-CM

## 2018-09-13 ENCOUNTER — Ambulatory Visit
Admission: RE | Admit: 2018-09-13 | Discharge: 2018-09-13 | Disposition: A | Discharge status: Home / Self Care | Payer: Medicare Other | Source: Ambulatory Visit | Attending: Internal Medicine | Admitting: Internal Medicine

## 2018-09-13 ENCOUNTER — Other Ambulatory Visit: Payer: Self-pay

## 2018-09-13 DIAGNOSIS — Z1231 Encounter for screening mammogram for malignant neoplasm of breast: Secondary | ICD-10-CM

## 2019-04-16 DIAGNOSIS — E7849 Other hyperlipidemia: Secondary | ICD-10-CM | POA: Diagnosis not present

## 2019-04-16 DIAGNOSIS — R739 Hyperglycemia, unspecified: Secondary | ICD-10-CM | POA: Diagnosis not present

## 2019-04-18 DIAGNOSIS — R739 Hyperglycemia, unspecified: Secondary | ICD-10-CM | POA: Diagnosis not present

## 2019-04-18 DIAGNOSIS — R82998 Other abnormal findings in urine: Secondary | ICD-10-CM | POA: Diagnosis not present

## 2019-04-18 DIAGNOSIS — Z0000 Encounter for general adult medical examination without abnormal findings: Secondary | ICD-10-CM | POA: Diagnosis not present

## 2019-04-18 DIAGNOSIS — M810 Age-related osteoporosis without current pathological fracture: Secondary | ICD-10-CM | POA: Diagnosis not present

## 2019-04-18 DIAGNOSIS — M1990 Unspecified osteoarthritis, unspecified site: Secondary | ICD-10-CM | POA: Diagnosis not present

## 2019-04-18 DIAGNOSIS — E785 Hyperlipidemia, unspecified: Secondary | ICD-10-CM | POA: Diagnosis not present

## 2019-04-18 DIAGNOSIS — R030 Elevated blood-pressure reading, without diagnosis of hypertension: Secondary | ICD-10-CM | POA: Diagnosis not present

## 2019-05-07 ENCOUNTER — Other Ambulatory Visit (HOSPITAL_COMMUNITY): Payer: Self-pay | Admitting: *Deleted

## 2019-05-08 ENCOUNTER — Ambulatory Visit (HOSPITAL_COMMUNITY)
Admission: RE | Admit: 2019-05-08 | Discharge: 2019-05-08 | Disposition: A | Discharge status: Home / Self Care | Payer: Medicare Other | Source: Ambulatory Visit | Attending: Internal Medicine | Admitting: Internal Medicine

## 2019-05-08 ENCOUNTER — Other Ambulatory Visit: Payer: Self-pay

## 2019-05-08 DIAGNOSIS — M810 Age-related osteoporosis without current pathological fracture: Secondary | ICD-10-CM | POA: Insufficient documentation

## 2019-05-08 MED ORDER — DENOSUMAB 60 MG/ML ~~LOC~~ SOSY
PREFILLED_SYRINGE | SUBCUTANEOUS | Status: AC
  Filled 2019-05-08: qty 1

## 2019-05-08 MED ORDER — DENOSUMAB 60 MG/ML ~~LOC~~ SOSY
60.0000 mg | PREFILLED_SYRINGE | Freq: Once | SUBCUTANEOUS | Status: AC
  Administered 2019-05-08: 60 mg via SUBCUTANEOUS

## 2019-05-22 DIAGNOSIS — Z1212 Encounter for screening for malignant neoplasm of rectum: Secondary | ICD-10-CM | POA: Diagnosis not present

## 2019-08-02 ENCOUNTER — Other Ambulatory Visit: Payer: Self-pay | Admitting: Internal Medicine

## 2019-08-02 DIAGNOSIS — Z1231 Encounter for screening mammogram for malignant neoplasm of breast: Secondary | ICD-10-CM

## 2019-09-16 ENCOUNTER — Ambulatory Visit: Payer: Medicare Other

## 2019-09-30 ENCOUNTER — Ambulatory Visit
Admission: RE | Admit: 2019-09-30 | Discharge: 2019-09-30 | Disposition: A | Discharge status: Home / Self Care | Payer: Medicare Other | Source: Ambulatory Visit | Attending: Internal Medicine | Admitting: Internal Medicine

## 2019-09-30 ENCOUNTER — Other Ambulatory Visit: Payer: Self-pay

## 2019-09-30 DIAGNOSIS — Z1231 Encounter for screening mammogram for malignant neoplasm of breast: Secondary | ICD-10-CM | POA: Diagnosis not present

## 2019-10-01 ENCOUNTER — Other Ambulatory Visit: Payer: Self-pay | Admitting: Internal Medicine

## 2019-10-01 DIAGNOSIS — R928 Other abnormal and inconclusive findings on diagnostic imaging of breast: Secondary | ICD-10-CM

## 2019-10-18 ENCOUNTER — Ambulatory Visit
Admission: RE | Admit: 2019-10-18 | Discharge: 2019-10-18 | Disposition: A | Discharge status: Home / Self Care | Payer: Medicare Other | Source: Ambulatory Visit | Attending: Internal Medicine | Admitting: Internal Medicine

## 2019-10-18 ENCOUNTER — Other Ambulatory Visit: Payer: Self-pay

## 2019-10-18 ENCOUNTER — Other Ambulatory Visit: Payer: Self-pay | Admitting: Internal Medicine

## 2019-10-18 DIAGNOSIS — R928 Other abnormal and inconclusive findings on diagnostic imaging of breast: Secondary | ICD-10-CM | POA: Diagnosis not present

## 2019-10-18 DIAGNOSIS — N6489 Other specified disorders of breast: Secondary | ICD-10-CM | POA: Diagnosis not present

## 2019-10-31 ENCOUNTER — Ambulatory Visit
Admission: RE | Admit: 2019-10-31 | Discharge: 2019-10-31 | Disposition: A | Discharge status: Home / Self Care | Payer: Medicare Other | Source: Ambulatory Visit | Attending: Internal Medicine | Admitting: Internal Medicine

## 2019-10-31 ENCOUNTER — Other Ambulatory Visit: Payer: Self-pay

## 2019-10-31 DIAGNOSIS — R928 Other abnormal and inconclusive findings on diagnostic imaging of breast: Secondary | ICD-10-CM

## 2019-10-31 DIAGNOSIS — N6012 Diffuse cystic mastopathy of left breast: Secondary | ICD-10-CM | POA: Diagnosis not present

## 2019-10-31 DIAGNOSIS — N6321 Unspecified lump in the left breast, upper outer quadrant: Secondary | ICD-10-CM | POA: Diagnosis not present

## 2019-11-01 ENCOUNTER — Other Ambulatory Visit: Payer: Self-pay | Admitting: Internal Medicine

## 2019-11-01 DIAGNOSIS — N63 Unspecified lump in unspecified breast: Secondary | ICD-10-CM

## 2019-11-11 ENCOUNTER — Other Ambulatory Visit: Payer: Self-pay | Admitting: Internal Medicine

## 2019-11-11 ENCOUNTER — Other Ambulatory Visit: Payer: Self-pay

## 2019-11-11 ENCOUNTER — Ambulatory Visit
Admission: RE | Admit: 2019-11-11 | Discharge: 2019-11-11 | Disposition: A | Discharge status: Home / Self Care | Payer: Medicare Other | Source: Ambulatory Visit | Attending: Internal Medicine | Admitting: Internal Medicine

## 2019-11-11 DIAGNOSIS — R928 Other abnormal and inconclusive findings on diagnostic imaging of breast: Secondary | ICD-10-CM

## 2019-11-11 DIAGNOSIS — N63 Unspecified lump in unspecified breast: Secondary | ICD-10-CM

## 2019-11-11 DIAGNOSIS — C50412 Malignant neoplasm of upper-outer quadrant of left female breast: Secondary | ICD-10-CM | POA: Diagnosis not present

## 2019-11-20 ENCOUNTER — Encounter: Payer: Self-pay | Admitting: Adult Health

## 2019-11-20 DIAGNOSIS — C50412 Malignant neoplasm of upper-outer quadrant of left female breast: Secondary | ICD-10-CM | POA: Insufficient documentation

## 2019-12-02 ENCOUNTER — Ambulatory Visit: Payer: Self-pay | Admitting: General Surgery

## 2019-12-02 DIAGNOSIS — C50412 Malignant neoplasm of upper-outer quadrant of left female breast: Secondary | ICD-10-CM | POA: Diagnosis not present

## 2019-12-02 DIAGNOSIS — Z170 Estrogen receptor positive status [ER+]: Secondary | ICD-10-CM | POA: Diagnosis not present

## 2019-12-04 ENCOUNTER — Telehealth: Payer: Self-pay | Admitting: Oncology

## 2019-12-04 NOTE — Telephone Encounter (Signed)
Received a new patient referral from [facility] for dx of breast cancer. Pt has been cld and scheduled to see Dr. Jana Hakim on 10/19 at 4pm w/labs at 330pm. Pt aware to arrive 15 min early. I offered an earlier appt date and time but the pt is currently our of town.

## 2019-12-04 NOTE — Progress Notes (Signed)
Location of Breast Cancer: left breast  Histology per Pathology Report: Breast, left, needle core biopsy, upper outer asymmetry - INVASIVE MAMMARY CARCINOMA, GRADE  Receptor Status: ER(80%), PR (0), Her2-neu (neg), Ki-(70)  Did patient present with symptoms (if so, please note symptoms) or was this found on screening mammography?: screening  Past/Anticipated interventions by surgeon, if OVA:NVBTYOMAYO to be sceduled Past/Anticipated interventions by medical oncology, if any: Chemotherapy appointment 19th  Lymphedema issues, if any:  none   Pain issues, if any:  none  SAFETY ISSUES:  Prior radiation? no  Pacemaker/ICD? no  Possible current pregnancy?no  Is the patient on methotrexate? no  Current Complaints / other details:  Spoke with patient she has no complaints of at this time. She has not had her lumpectomy scheduled yet. She sees medical oncology next week.     Ross Marcus, RN 12/04/2019,1:16 PM

## 2019-12-05 ENCOUNTER — Ambulatory Visit
Admission: RE | Admit: 2019-12-05 | Discharge: 2019-12-05 | Disposition: A | Discharge status: Home / Self Care | Payer: Medicare Other | Source: Ambulatory Visit | Attending: Radiation Oncology | Admitting: Radiation Oncology

## 2019-12-05 ENCOUNTER — Other Ambulatory Visit: Payer: Self-pay

## 2019-12-05 DIAGNOSIS — Z170 Estrogen receptor positive status [ER+]: Secondary | ICD-10-CM

## 2019-12-05 DIAGNOSIS — C50412 Malignant neoplasm of upper-outer quadrant of left female breast: Secondary | ICD-10-CM | POA: Diagnosis not present

## 2019-12-05 NOTE — Progress Notes (Signed)
Radiation Oncology         [phone] ________________________________  Initial Outpatient Consultation - Conducted via telephone due to current COVID-19 concerns for limiting patient exposure  I spoke with the patient to conduct this consult visit via telephone to spare the patient unnecessary potential exposure in the healthcare setting during the current COVID-19 pandemic. The patient was notified in advance and was offered a [place] meeting to allow for face to face communication but unfortunately reported that they did not have the appropriate resources/technology to support such a visit and instead preferred to proceed with a telephone consult.     Name: Holly Leon        MRN: [id number]  Date of Service: 12/05/2019 DOB: 1935-08-05  TF:TDDUK, Jenny Reichmann, MD  Jovita Kussmaul, MD     REFERRING PHYSICIAN: Autumn Messing III, MD   DIAGNOSIS: The encounter diagnosis was Malignant neoplasm of upper-outer quadrant of left breast in female, estrogen receptor positive ([place]).   HISTORY OF PRESENT ILLNESS: Holly Leon is a 84 y.o. female with a  new diagnosis of left breast cancer. The patient was noted to have a possible asymmetry on screening mammography in the left breast in August 2021.  Further diagnostic imaging revealed a small indeterminate mass at 1:30 position in the left breast, ultrasound measured this at 9 x 8 x 7 mm.  No adenopathy was identified she subsequently underwent ultrasound-guided biopsy on 10/31/19 that revealed fibrocystic changes at the 1:30 position.  This was felt to be discordant, and repeat biopsy on 11/11/2019 revealed an invasive mammary carcinoma grade 3 with E-cadherin positive consistent with a ductal carcinoma, her tumor was ER positive, PR negative, HER-2 was positive by FISH, and her Ki-67 was 70%.  She is contacted today to discuss options of treatment.   PREVIOUS RADIATION THERAPY: No   PAST MEDICAL HISTORY:  Past Medical History:  Diagnosis Date    Hyperlipidemia    Osteoporosis    Postural dizziness with presyncope        PAST SURGICAL HISTORY: Past Surgical History:  Procedure Laterality Date   TONSILLECTOMY     TUBAL LIGATION       FAMILY HISTORY:  Family History  Problem Relation Age of Onset   Colon cancer Neg Hx    Esophageal cancer Neg Hx    Rectal cancer Neg Hx    Stomach cancer Neg Hx      SOCIAL HISTORY:  reports that she has never smoked. She has never used smokeless tobacco. She reports that she does not drink alcohol and does not use drugs.  The patient is widowed and lives in [place]. 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   ALLERGIES: Lipitor [atorvastatin]   MEDICATIONS:  Current Outpatient Medications  Medication Sig Dispense Refill   aspirin 81 MG chewable tablet Chew by mouth daily.     calcium citrate-vitamin D (CITRACAL+D) 315-200 MG-UNIT tablet Take 1 tablet by mouth 2 (two) times daily.     Multiple Vitamins-Minerals (CENTRUM ADULTS PO) Take by mouth.     simvastatin (ZOCOR) 40 MG tablet Take 40 mg by mouth daily.     vitamin B-12 (CYANOCOBALAMIN) 500 MCG tablet Take 500 mcg by mouth daily.     vitamin C (ASCORBIC ACID) 500 MG tablet Take 500 mg by mouth daily.     vitamin E 400 UNIT capsule Take 400 Units by mouth daily.     No current facility-administered medications for this  encounter.     REVIEW OF SYSTEMS: On review of systems, the patient reports that she is doing well overall. She denies any chest pain, shortness of breath, cough, fevers, chills, night sweats, unintended weight changes. She denies any bowel or bladder disturbances, and denies abdominal pain, nausea or vomiting. She denies any new musculoskeletal or joint aches or pains. A complete review of systems is obtained and is otherwise negative.     PHYSICAL EXAM:  Unable to assess due to encounter    ECOG = 0  0 - Asymptomatic (Fully active, able  to carry on all predisease activities without restriction)  1 - Symptomatic but completely ambulatory (Restricted in physically strenuous activity but ambulatory and able to carry out work of a light or sedentary nature. For example, light housework, office work)  2 - Symptomatic, <50% in bed during the day (Ambulatory and capable of all self care but unable to carry out any work activities. Up and about more than 50% of waking hours)  3 - Symptomatic, >50% in bed, but not bedbound (Capable of only limited self-care, confined to bed or chair 50% or more of waking hours)  4 - Bedbound (Completely disabled. Cannot carry on any self-care. Totally confined to bed or chair)  5 - Death   Eustace Pen MM, Creech RH, Tormey DC, et al. [phone]). "Toxicity and response criteria of the [facility]". [place] Oncol. 5 (6): 649-55    LABORATORY DATA:  Lab Results  Component Value Date   WBC 12.1 (H) 03/24/2008   HGB 14.4 03/24/2008   HCT 42.1 03/24/2008   MCV 88.2 03/24/2008   PLT 290 03/24/2008   Lab Results  Component Value Date   NA 140 03/24/2008   K 3.6 03/24/2008   CL 106 03/24/2008   CO2 25 03/24/2008   No results found for: ALT, AST, GGT, ALKPHOS, BILITOT    RADIOGRAPHY: MM CLIP PLACEMENT LEFT  Result Date: 11/11/2019 CLINICAL DATA:  Evaluate X biopsy clip placement following 3D/stereotactic guided LEFT breast biopsy. EXAM: DIAGNOSTIC LEFT MAMMOGRAM POST STEREOTACTIC BIOPSY COMPARISON:  Previous exam(s). FINDINGS: Mammographic images were obtained following 3D/stereotactic guided biopsy of focal asymmetry within the UPPER-OUTER LEFT breast. The X biopsy marking clip is in expected position at the site of biopsy. IMPRESSION: Appropriate positioning of the X shaped biopsy marking clip at the site of biopsy in the UPPER OUTER LEFT breast. Final Assessment: Post Procedure Mammograms for Marker Placement Electronically Signed   By: Margarette Canada M.D.   On: 11/11/2019 08:14    MM LT BREAST BX W LOC DEV 1ST LESION IMAGE BX SPEC STEREO GUIDE  Addendum Date: 11/12/2019   ADDENDUM REPORT: 11/12/2019 15:53 ADDENDUM: Pathology revealed GRADE III INVASIVE MAMMARY CARCINOMA of the LEFT breast, upper outer asymmetry. This was found to be concordant by Dr. Hassan Rowan. Pathology results were discussed with the patient by telephone with Terie Purser RN. The patient reported doing well after the biopsy with tenderness at the site. Post biopsy instructions and care were reviewed and questions were answered. The patient was encouraged to call [facility] for any additional concerns. Surgical consultation has been arranged with Dr. Autumn Messing at [facility] on December 01, 2019. Pathology results reported by Stacie Acres RN on 11/12/2019. Electronically Signed   By: Margarette Canada M.D.   On: 11/12/2019 15:53   Result Date: 11/12/2019 CLINICAL DATA:  84 year old female for tissue sampling of UPPER-OUTER LEFT breast asymmetry. Recent  biopsy of sonographic mass which did not represent the mammographic abnormality. EXAM: LEFT BREAST STEREOTACTIC CORE NEEDLE BIOPSY COMPARISON:  Previous exams. FINDINGS: The patient and I discussed the procedure of stereotactic-guided biopsy including benefits and alternatives. We discussed the high likelihood of a successful procedure. We discussed the risks of the procedure including infection, bleeding, tissue injury, clip migration, and inadequate sampling. Informed written consent was given. The usual time out protocol was performed immediately prior to the procedure. Using sterile technique and 1% Lidocaine as local anesthetic, under stereotactic guidance, a 9 gauge vacuum assisted device was used to perform core needle biopsy of focal asymmetry within the UPPER-OUTER LEFT breast using a SUPERIOR approach. Specimen radiograph was performed. Lesion quadrant: Upper outer quadrant At the conclusion of the procedure, an X tissue marker  clip was deployed into the biopsy cavity. Follow-up 2-view mammogram was performed and dictated separately. IMPRESSION: Stereotactic-guided biopsy of UPPER OUTER LEFT breast asymmetry. No apparent complications. Electronically Signed: By: Margarette Canada M.D. On: 11/11/2019 08:10       IMPRESSION/PLAN: 1. Stage IA, [id number] grade 3 ER positive, HER2 amplified invasive ductal carcinoma of the left breast. Dr. Lisbeth Renshaw discusses the pathology findings and reviews the nature of estrogen positive and HER-2 amplified breast disease.  Dr. Marlou Starks is anticipating lumpectomy without the need for sentinel node sampling.  She is planning to meet with medical oncology as well, and we did discuss that typically cases where patients have HER-2 amplification systemic therapy would be recommended either with chemo and targeted anti-HER-2 therapy versus single agent targeted anti-HER-2 therapy.  Dr. Lisbeth Renshaw reviews the rationale for radiotherapy in women who undergo breast conservation.  More specifically he discussed her case and higher risk features of HER2 amplification and would recommend external radiotherapy to the breast followed by antiestrogen therapy. We discussed the risks, benefits, short, and long term effects of radiotherapy, and the patient is interested in proceeding to be aggressive. Dr. Lisbeth Renshaw discusses the delivery and logistics of radiotherapy and anticipates a course of 4 weeks of radiotherapy. We will see her back a few weeks after surgery to discuss the simulation process and anticipate we starting radiotherapy about 4-6 weeks after surgery.    Given current concerns for patient exposure during the COVID-19 pandemic, this encounter was conducted via telephone.  The patient has provided two factor identification and has given verbal consent for this type of encounter and has been advised to only accept a meeting of this type in a secure network environment. The time spent during this encounter was 60 minutes  including preparation, discussion, and coordination of the patient's care. The attendants for this meeting include Dr. Lisbeth Renshaw, Hayden Pedro  and [name].  During the encounter, Dr. Lisbeth Renshaw, and Hayden Pedro were located at [facility] Radiation Oncology Department.  Latonia Conrow was located at home.   The above documentation reflects my direct findings during this shared patient visit. Please see the separate note by Dr. Lisbeth Renshaw on this date for the remainder of the patient's plan of care.    Carola Rhine, PAC

## 2019-12-06 ENCOUNTER — Other Ambulatory Visit: Payer: Self-pay | Admitting: General Surgery

## 2019-12-06 DIAGNOSIS — C50412 Malignant neoplasm of upper-outer quadrant of left female breast: Secondary | ICD-10-CM

## 2019-12-06 DIAGNOSIS — Z170 Estrogen receptor positive status [ER+]: Secondary | ICD-10-CM

## 2019-12-09 NOTE — Progress Notes (Signed)
[facility]  [phone] [phone]     ID: Holly Leon DOB: 07/19/1935  MR#: [id number]  XKG#:[id number]  Patient Care Team: Holly Baton, MD as PCP - General (Internal Medicine) Holly Kussmaul, MD as Consulting Physician (General Surgery) Holly Rudd, MD as Consulting Physician (Radiation Oncology) Holly Leon, Holly Dad, MD as Consulting Physician (Oncology) Holly Cruel, MD OTHER MD:  CHIEF COMPLAINT: HER-2 positive estrogen receptor positive breast cancer  CURRENT TREATMENT: Awaiting definitive surgery   HISTORY OF CURRENT ILLNESS: Holly Leon had routine screening mammography on 09/30/2019 showing a possible abnormality in the left breast. She underwent left diagnostic mammography with tomography and left breast ultrasonography at [facility] on 10/18/2019 showing: breast density category B; indeterminate 9 mm left breast mass at 1:30; no axillary adenopathy.  Accordingly on 10/31/2019 she proceeded to biopsy of the left breast area in question. The pathology from this procedure ([id number]) showed: fibrocystic changes. On post-biopsy mammogram, the biopsy clip was found to have migrated approximately 3.6 cm medially. She returned for repeat biopsy on 11/11/2019, with pathology ([id number]) showing: invasive mammary carcinoma, grade 3, e-cadherin positive. Prognostic indicators significant for: estrogen receptor, 80% positive with moderate staining intensity and progesterone receptor, 0% negative. Proliferation marker Ki67 at 70%. HER2 equivocal by immunohistochemistry (2+), but positive by fluorescent in situ hybridization with a signals ratio 2.83 and number per cell 5.65.  [name]   INTERVAL HISTORY: Holly Leon was evaluated in the breast cancer clinic on 12/10/2019 accompanied by her friend Holly Leon.   Holly Leon is scheduled for lumpectomy 01/22/2020   REVIEW OF SYSTEMS: There were no  specific symptoms leading to the original mammogram, which was routinely scheduled. The patient denies unusual headaches, visual changes, nausea, vomiting, stiff neck, dizziness, or gait imbalance. There has been no cough, phlegm production, or pleurisy, no chest pain or pressure, and no change in bowel or bladder habits. The patient denies fever, rash, bleeding, unexplained fatigue or unexplained weight loss. She has not received the COVID-19 vaccine and is not interested in receiving it. A detailed review of systems was otherwise entirely negative.   PAST MEDICAL HISTORY: Past Medical History:  Diagnosis Date   Hyperlipidemia    Osteoporosis    Postural dizziness with presyncope     PAST SURGICAL HISTORY: Past Surgical History:  Procedure Laterality Date   TONSILLECTOMY     TUBAL LIGATION      FAMILY HISTORY: Family History  Problem Relation Age of Onset   Colon cancer Neg Hx    Esophageal cancer Neg Hx    Rectal cancer Neg Hx    Stomach cancer Neg Hx   The patient's father died at the age of 10 from heart disease. The patient's mother died at the age of 65 from heart disease. The patient has 1 brother who died at age 59 from heart disease. There have been some cancers on both sides of the family but the patient really does not have information regarding what type of cancers they were or when the diagnoses were made. She will try to get that information for [place]   GYNECOLOGIC HISTORY:  No LMP recorded. Patient is postmenopausal. Menarche: 84 years old Age at first live birth: 84 years old [place] P 2 LMP early 65s Contraceptive early for several years without complications HRT no Hysterectomy? no BSO? no   SOCIAL HISTORY: (updated 11/2019)  Holly Leon is widowed and lives by herself with no pets. Her husband was in  the [employer]. 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 Her son Holly Leon lives in [place] and  her son Holly Leon lives in [place]. They both work in [employer]. The patient has no grandchildren. She attends [employer]    ADVANCED DIRECTIVES: Not in place. At the 12/10/2019 visit the patient was given the appropriate documents to complete and notarized at her discretion. She is planning to name her son Holly Leon as her healthcare power of attorney   HEALTH MAINTENANCE: Social History   Tobacco Use   Smoking status: Never Smoker   Smokeless tobacco: Never Used  Vaping Use   Vaping Use: Never used  Substance Use Topics   Alcohol use: No   Drug use: No     Colonoscopy: 12/2016  PAP: none on file  Bone density: none on file   Allergies  Allergen Reactions   Lipitor [Atorvastatin] Hives    Current Outpatient Medications  Medication Sig Dispense Refill   aspirin 81 MG chewable tablet Chew by mouth daily.     calcium citrate-vitamin D (CITRACAL+D) 315-200 MG-UNIT tablet Take 1 tablet by mouth 2 (two) times daily.     Multiple Vitamins-Minerals (CENTRUM ADULTS PO) Take by mouth.     simvastatin (ZOCOR) 40 MG tablet Take 40 mg by mouth daily.     vitamin B-12 (CYANOCOBALAMIN) 500 MCG tablet Take 500 mcg by mouth daily.     vitamin C (ASCORBIC ACID) 500 MG tablet Take 500 mg by mouth daily.     vitamin E 400 UNIT capsule Take 400 Units by mouth daily.     No current facility-administered medications for this visit.    OBJECTIVE: White woman who appears younger than stated age  38:   12/10/19 1528  BP: (!) 167/76  Pulse: 87  Resp: 16  Temp: 97.8 F (36.6 C)  SpO2: 99%     Body mass index is 25.34 kg/m.   Wt Readings from Last 3 Encounters:  12/10/19 136 lb 4.8 oz (61.8 kg)  01/05/17 135 lb (61.2 kg)  12/14/16 135 lb (61.2 kg)      ECOG FS:1 - Symptomatic but completely ambulatory  Ocular: Sclerae unicteric, pupils round and equal Ear-nose-throat: Wearing a mask Lymphatic: No cervical or supraclavicular adenopathy Lungs no rales or  rhonchi Heart regular rate and rhythm Abd soft, nontender, positive bowel sounds MSK no focal spinal tenderness, no joint edema Neuro: non-focal, well-oriented, appropriate affect Breasts: The right breast is unremarkable. The left breast is status post recent biopsy. There is no ecchymosis, no palpable mass and no skin or nipple changes of concern. Both axillae are benign.   LAB RESULTS:  CMP     Component Value Date/Time   NA 140 12/10/2019 1448   K 3.5 12/10/2019 1448   CL 107 12/10/2019 1448   CO2 26 12/10/2019 1448   GLUCOSE 99 12/10/2019 1448   BUN 11 12/10/2019 1448   CREATININE 0.87 12/10/2019 1448   CALCIUM 9.8 12/10/2019 1448   PROT 8.1 12/10/2019 1448   ALBUMIN 3.7 12/10/2019 1448   AST 16 12/10/2019 1448   ALT 15 12/10/2019 1448   ALKPHOS 86 12/10/2019 1448   BILITOT 0.5 12/10/2019 1448   GFRNONAA >60 12/10/2019 1448   GFRAA  03/24/2008 1642    >60        The eGFR has been calculated using the MDRD equation. This calculation has not been validated in all clinical situations. eGFR's  persistently <60 mL/min signify possible Chronic Kidney Disease.    No results found for: TOTALPROTELP, ALBUMINELP, A1GS, A2GS, BETS, BETA2SER, GAMS, MSPIKE, SPEI  Lab Results  Component Value Date   WBC 6.2 12/10/2019   NEUTROABS 3.9 12/10/2019   HGB 13.5 12/10/2019   HCT 40.7 12/10/2019   MCV 90.4 12/10/2019   PLT 317 12/10/2019    No results found for: LABCA2  No components found for: QZRAQT622  No results for input(s): INR in the last 168 hours.  No results found for: LABCA2  No results found for: QJF354  No results found for: TGY563  No results found for: SLH734  No results found for: CA2729  No components found for: HGQUANT  No results found for: CEA1 / No results found for: CEA1   No results found for: AFPTUMOR  No results found for: CHROMOGRNA  No results found for: KPAFRELGTCHN, LAMBDASER, KAPLAMBRATIO (kappa/lambda light chains)  No  results found for: HGBA, HGBA2QUANT, HGBFQUANT, HGBSQUAN (Hemoglobinopathy evaluation)   No results found for: LDH  No results found for: IRON, TIBC, IRONPCTSAT (Iron and TIBC)  No results found for: FERRITIN  Urinalysis No results found for: COLORURINE, APPEARANCEUR, LABSPEC, PHURINE, GLUCOSEU, HGBUR, BILIRUBINUR, KETONESUR, PROTEINUR, UROBILINOGEN, NITRITE, LEUKOCYTESUR   STUDIES: MM CLIP PLACEMENT LEFT  Result Date: 11/11/2019 CLINICAL DATA:  Evaluate X biopsy clip placement following 3D/stereotactic guided LEFT breast biopsy. EXAM: DIAGNOSTIC LEFT MAMMOGRAM POST STEREOTACTIC BIOPSY COMPARISON:  Previous exam(s). FINDINGS: Mammographic images were obtained following 3D/stereotactic guided biopsy of focal asymmetry within the UPPER-OUTER LEFT breast. The X biopsy marking clip is in expected position at the site of biopsy. IMPRESSION: Appropriate positioning of the X shaped biopsy marking clip at the site of biopsy in the UPPER OUTER LEFT breast. Final Assessment: Post Procedure Mammograms for Marker Placement Electronically Signed   By: Holly Leon M.D.   On: 11/11/2019 08:14   MM LT BREAST BX W LOC DEV 1ST LESION IMAGE BX SPEC STEREO GUIDE  Addendum Date: 11/12/2019   ADDENDUM REPORT: 11/12/2019 15:53 ADDENDUM: Pathology revealed GRADE III INVASIVE MAMMARY CARCINOMA of the LEFT breast, upper outer asymmetry. This was found to be concordant by Dr. Hassan Rowan. Pathology results were discussed with the patient by telephone with Holly Purser RN. The patient reported doing well after the biopsy with tenderness at the site. Post biopsy instructions and care were reviewed and questions were answered. The patient was encouraged to call [facility] for any additional concerns. Surgical consultation has been arranged with Dr. Autumn Leon at [facility] on December 01, 2019. Pathology results reported by Holly Acres RN on 11/12/2019. Electronically Signed   By: Holly Leon M.D.   On: 11/12/2019 15:53   Result Date: 11/12/2019 CLINICAL DATA:  84 year old female for tissue sampling of UPPER-OUTER LEFT breast asymmetry. Recent biopsy of sonographic mass which did not represent the mammographic abnormality. EXAM: LEFT BREAST STEREOTACTIC CORE NEEDLE BIOPSY COMPARISON:  Previous exams. FINDINGS: The patient and I discussed the procedure of stereotactic-guided biopsy including benefits and alternatives. We discussed the high likelihood of a successful procedure. We discussed the risks of the procedure including infection, bleeding, tissue injury, clip migration, and inadequate sampling. Informed written consent was given. The usual time out protocol was performed immediately prior to the procedure. Using sterile technique and 1% Lidocaine as local anesthetic, under stereotactic guidance, a 9 gauge vacuum assisted device was used to perform core needle biopsy of focal asymmetry within the UPPER-OUTER LEFT breast using  a SUPERIOR approach. Specimen radiograph was performed. Lesion quadrant: Upper outer quadrant At the conclusion of the procedure, an X tissue marker clip was deployed into the biopsy cavity. Follow-up 2-view mammogram was performed and dictated separately. IMPRESSION: Stereotactic-guided biopsy of UPPER OUTER LEFT breast asymmetry. No apparent complications. Electronically Signed: By: Holly Leon M.D. On: 11/11/2019 08:10     ELIGIBLE FOR AVAILABLE RESEARCH PROTOCOL: no   ASSESSMENT: 84 y.o. Holly Leon woman status post left breast upper outer quadrant biopsy 11/11/2019 for a clinical T1b N0, stage IA invasive ductal carcinoma, grade 3, estrogen receptor positive, progesterone receptor negative, HER-2 amplified, with an MIB-1 of 70%  (1) definitive surgery pending 01/22/2020  (2) anti-HER-2 immunotherapy to start 02/04/2020  (3) adjuvant radiation to follow  (4) antiestrogens to start at the completion of local treatment    PLAN: I met today with  Holly Leon to review her new diagnosis. Specifically we discussed the biology of her breast cancer, its diagnosis, staging, treatment  options and prognosis. We first reviewed the fact that cancer is not one disease but more than 100 different diseases and that it is important to keep them separate-- otherwise when friends and relatives discuss their own cancer experiences with Holly Leon confusion can result. Similarly we explained that if breast cancer spreads to the bone or liver, the patient would not have bone cancer or liver cancer, but breast cancer in the bone and breast cancer in the liver: one cancer in three places-- not 3 different cancers which otherwise would have to be treated in 3 different ways.  We discussed the difference between local and systemic therapy. In terms of loco-regional treatment, lumpectomy plus radiation is equivalent to mastectomy as far as survival is concerned. For this reason, and because the cosmetic results are generally superior, we recommend breast conserving surgery.   We then discussed the rationale for systemic therapy. There is some risk that this cancer may have already spread to other parts of her body. Patients frequently ask at this point about bone scans, CAT scans and PET scans to find out if they have occult breast cancer somewhere else. The problem is that in early stage disease we are much more likely to find false positives then true cancers and this would expose the patient to unnecessary procedures as well as unnecessary radiation. Scans cannot answer the question the patient really would like to know, which is whether she has microscopic disease elsewhere in her body. For those reasons we do not recommend them.  Of course we would proceed to aggressive evaluation of any symptoms that might suggest metastatic disease, but that is not the case here.  Next we went over the options for systemic therapy which are anti-estrogens, anti-HER-2 immunotherapy, and  chemotherapy. Holly Leon meets criteria for all 3 and in her the question is how much treatment does she actually need to have an optimal chance of cure.  In general, antiestrogens cut the risk of recurrence in half and anti-HER-2 treatment also cut the risk of recurrence in half. Chemotherapy by contrast cuts the risk of recurrence by about one third. If the patient's risk of recurrence systemically after optimal surgery and radiation is 32% then Herceptin would drop that to about 16% and chemotherapy would drop it to about 8%. Adding Taxol to this would drop at an additional 2%.  Accordingly I am comfortable foregoing the chemotherapy option but pursuing the anti-HER-2 option since antiestrogen pills alone will not work well unless the HER-2 crosstalk is silenced.  The plan then is to start with surgery. The patient will start Herceptin a couple of weeks later and continue that a minimum of 6 months. We will obtain an echocardiogram before starting and every 3 months thereafter. She may start radiation around the same time. Once she stops the radiation she can start antiestrogens, most likely anastrozole  We discussed the possibility of a port and Holly Leon is comfortable receiving the Herceptin peripherally.  Holly Leon has a good understanding of the overall plan. She agrees with it. She knows the goal of treatment in her case is cure. She will call with any problems that may develop before her next visit here which will be 02/04/2020  Total encounter time 65 minutes.Sarajane Jews C. Wenona [place], MD 12/10/2019 4:40 PM Medical Oncology and Hematology [facility], [place] [address] Tel. [phone]    Fax. [phone]   This document serves as a record of services personally performed by Lurline Del, MD. It was created on his behalf by Holly Leon, a trained medical scribe. The creation of this record is based on the scribe's personal observations and the  provider's statements to them.   I, Lurline Del MD, have reviewed the above documentation for accuracy and completeness, and I agree with the above.    *Total Encounter Time as defined by the Centers for Medicare and Medicaid Services includes, in addition to the face-to-face time of a patient visit (documented in the note above) non-face-to-face time: obtaining and reviewing outside history, ordering and reviewing medications, tests or procedures, care coordination (communications with other health care professionals or caregivers) and documentation in the medical record.

## 2019-12-10 ENCOUNTER — Other Ambulatory Visit: Payer: Self-pay

## 2019-12-10 ENCOUNTER — Inpatient Hospital Stay: Payer: Medicare Other | Attending: Oncology | Admitting: Oncology

## 2019-12-10 ENCOUNTER — Inpatient Hospital Stay: Payer: Medicare Other

## 2019-12-10 VITALS — BP 167/76 | HR 87 | Temp 97.8°F | Resp 16 | Ht 61.5 in | Wt 136.3 lb

## 2019-12-10 DIAGNOSIS — Z8249 Family history of ischemic heart disease and other diseases of the circulatory system: Secondary | ICD-10-CM

## 2019-12-10 DIAGNOSIS — C50412 Malignant neoplasm of upper-outer quadrant of left female breast: Secondary | ICD-10-CM | POA: Diagnosis not present

## 2019-12-10 DIAGNOSIS — E785 Hyperlipidemia, unspecified: Secondary | ICD-10-CM

## 2019-12-10 DIAGNOSIS — Z7982 Long term (current) use of aspirin: Secondary | ICD-10-CM | POA: Diagnosis not present

## 2019-12-10 DIAGNOSIS — Z79899 Other long term (current) drug therapy: Secondary | ICD-10-CM | POA: Diagnosis not present

## 2019-12-10 DIAGNOSIS — Z170 Estrogen receptor positive status [ER+]: Secondary | ICD-10-CM

## 2019-12-10 LAB — CMP (CANCER CENTER ONLY)
ALT: 15 U/L (ref 0–44)
AST: 16 U/L (ref 15–41)
Albumin: 3.7 g/dL (ref 3.5–5.0)
Alkaline Phosphatase: 86 U/L (ref 38–126)
Anion gap: 7 (ref 5–15)
BUN: 11 mg/dL (ref 8–23)
CO2: 26 mmol/L (ref 22–32)
Calcium: 9.8 mg/dL (ref 8.9–10.3)
Chloride: 107 mmol/L (ref 98–111)
Creatinine: 0.87 mg/dL (ref 0.44–1.00)
GFR, Estimated: >60 mL/min (ref >60)
Glucose, Bld: 99 mg/dL (ref 70–99)
Potassium: 3.5 mmol/L (ref 3.5–5.1)
Sodium: 140 mmol/L (ref 135–145)
Total Bilirubin: 0.5 mg/dL (ref 0.3–1.2)
Total Protein: 8.1 g/dL (ref 6.5–8.1)

## 2019-12-10 LAB — CBC WITH DIFFERENTIAL (CANCER CENTER ONLY)
Abs Immature Granulocytes: 0.02 10*3/uL (ref 0.00–0.07)
Basophils Absolute: 0 10*3/uL (ref 0.0–0.1)
Basophils Relative: 1 %
Eosinophils Absolute: 0.3 10*3/uL (ref 0.0–0.5)
Eosinophils Relative: 4 %
HCT: 40.7 % (ref 36.0–46.0)
Hemoglobin: 13.5 g/dL (ref 12.0–15.0)
Immature Granulocytes: 0 %
Lymphocytes Relative: 23 %
Lymphs Abs: 1.4 10*3/uL (ref 0.7–4.0)
MCH: 30 pg (ref 26.0–34.0)
MCHC: 33.2 g/dL (ref 30.0–36.0)
MCV: 90.4 fL (ref 80.0–100.0)
Monocytes Absolute: 0.6 10*3/uL (ref 0.1–1.0)
Monocytes Relative: 9 %
Neutro Abs: 3.9 10*3/uL (ref 1.7–7.7)
Neutrophils Relative %: 63 %
Platelet Count: 317 10*3/uL (ref 150–400)
RBC: 4.5 MIL/uL (ref 3.87–5.11)
RDW: 12.6 % (ref 11.5–15.5)
WBC Count: 6.2 10*3/uL (ref 4.0–10.5)
nRBC: 0 % (ref 0.0–0.2)

## 2019-12-11 ENCOUNTER — Telehealth: Payer: Self-pay | Admitting: *Deleted

## 2019-12-11 ENCOUNTER — Encounter: Payer: Self-pay | Admitting: *Deleted

## 2019-12-11 DIAGNOSIS — C50412 Malignant neoplasm of upper-outer quadrant of left female breast: Secondary | ICD-10-CM

## 2019-12-11 DIAGNOSIS — Z170 Estrogen receptor positive status [ER+]: Secondary | ICD-10-CM

## 2019-12-11 NOTE — Telephone Encounter (Signed)
Left vm providing navigation resources and contact information. Request return call for questions or needs.  

## 2019-12-11 NOTE — Telephone Encounter (Signed)
Spoke to pt and provided navigation resources and contact information. Denies questions or concerns regarding dx or treatment care plan. Encourage pt to call with needs. Confirmed future appts.

## 2019-12-12 ENCOUNTER — Telehealth: Payer: Self-pay | Admitting: Licensed Clinical Social Worker

## 2019-12-12 ENCOUNTER — Encounter: Payer: Self-pay | Admitting: *Deleted

## 2019-12-12 NOTE — Telephone Encounter (Signed)
[place] [employer] representative attempted to contact patient by phone  to offer support and assess for needs in patient with newly diagnosed breast cancer. No answer. Left VM with direct contact information.     Clint Biello, [place], [place] Worker [facility]

## 2019-12-13 NOTE — Telephone Encounter (Signed)
Spoke with patient. CSW informed patient of the support team and support services at [facility]   Patient states that she is doing well. She is still working (at [employer] and [employer]) and stays active. She has many friends who are supportive with emotional and practical needs. No concerns with transportation, food, housing at this time.  CSW provided contact information and encouraged patient to call with any questions or concerns.   Edwinna Areola Rashia Mckesson, LCSW

## 2019-12-17 ENCOUNTER — Telehealth: Payer: Self-pay | Admitting: Oncology

## 2019-12-17 NOTE — Telephone Encounter (Signed)
scheduled patient 10/29 called lvm

## 2019-12-20 ENCOUNTER — Other Ambulatory Visit: Payer: Self-pay | Admitting: *Deleted

## 2019-12-20 ENCOUNTER — Other Ambulatory Visit: Payer: Self-pay

## 2019-12-20 ENCOUNTER — Inpatient Hospital Stay: Payer: Medicare Other

## 2019-12-20 DIAGNOSIS — C50412 Malignant neoplasm of upper-outer quadrant of left female breast: Secondary | ICD-10-CM

## 2019-12-20 MED ORDER — PROCHLORPERAZINE MALEATE 10 MG PO TABS: 10.0000 mg | ORAL_TABLET | Freq: Four times a day (QID) | ORAL | 0 refills | Status: DC | PRN

## 2019-12-20 NOTE — Progress Notes (Signed)
Referral faxed and called to [facility] for evaluation of spot on face

## 2020-01-01 DIAGNOSIS — L821 Other seborrheic keratosis: Secondary | ICD-10-CM | POA: Diagnosis not present

## 2020-01-02 ENCOUNTER — Other Ambulatory Visit: Payer: Self-pay

## 2020-01-02 ENCOUNTER — Ambulatory Visit (HOSPITAL_COMMUNITY): Payer: Medicare Other | Attending: Internal Medicine

## 2020-01-02 DIAGNOSIS — Z0189 Encounter for other specified special examinations: Secondary | ICD-10-CM

## 2020-01-02 DIAGNOSIS — C50412 Malignant neoplasm of upper-outer quadrant of left female breast: Secondary | ICD-10-CM | POA: Diagnosis not present

## 2020-01-02 DIAGNOSIS — Z170 Estrogen receptor positive status [ER+]: Secondary | ICD-10-CM

## 2020-01-02 LAB — ECHOCARDIOGRAM COMPLETE
AV Mean grad: 8 mmHg
Area-P 1/2: 2.56 cm2
S' Lateral: 1.3 cm

## 2020-01-02 MED ORDER — PERFLUTREN LIPID MICROSPHERE
1.0000 mL | INTRAVENOUS | Status: AC | PRN
  Administered 2020-01-02: 1 mL via INTRAVENOUS

## 2020-01-07 ENCOUNTER — Other Ambulatory Visit (HOSPITAL_COMMUNITY): Payer: Medicare Other

## 2020-01-13 ENCOUNTER — Other Ambulatory Visit: Payer: Self-pay

## 2020-01-13 ENCOUNTER — Encounter (HOSPITAL_BASED_OUTPATIENT_CLINIC_OR_DEPARTMENT_OTHER): Payer: Self-pay | Admitting: General Surgery

## 2020-01-14 NOTE — Progress Notes (Signed)
      Enhanced Recovery after Surgery for Orthopedics Enhanced Recovery after Surgery is a protocol used to improve the stress on your body and your recovery after surgery.  Patient Instructions  . The night before surgery:  o No food after midnight. ONLY clear liquids after midnight  . The day of surgery (if you do NOT have diabetes):  o Drink ONE (1) Pre-Surgery Clear Ensure as directed.   o This drink was given to you during your hospital  pre-op appointment visit. o The pre-op nurse will instruct you on the time to drink the  Pre-Surgery Ensure depending on your surgery time. o Finish the drink at the designated time by the pre-op nurse.  o Nothing else to drink after completing the  Pre-Surgery Clear Ensure.  . The day of surgery (if you have diabetes): o Drink ONE (1) Gatorade 2 (G2) as directed. o This drink was given to you during your hospital  pre-op appointment visit.  o The pre-op nurse will instruct you on the time to drink the   Gatorade 2 (G2) depending on your surgery time. o Color of the Gatorade may vary. Red is not allowed. o Nothing else to drink after completing the  Gatorade 2 (G2).         If you have questions, please contact your surgeon's office.  Surgical soap given with instructions. Patient verbalized instructions.

## 2020-01-18 ENCOUNTER — Other Ambulatory Visit (HOSPITAL_COMMUNITY)
Admission: RE | Admit: 2020-01-18 | Discharge: 2020-01-18 | Disposition: A | Discharge status: Home / Self Care | Payer: Medicare Other | Source: Ambulatory Visit | Attending: General Surgery | Admitting: General Surgery

## 2020-01-18 DIAGNOSIS — Z01818 Encounter for other preprocedural examination: Secondary | ICD-10-CM | POA: Insufficient documentation

## 2020-01-18 DIAGNOSIS — Z20822 Contact with and (suspected) exposure to covid-19: Secondary | ICD-10-CM | POA: Insufficient documentation

## 2020-01-19 LAB — SARS CORONAVIRUS 2 (TAT 6-24 HRS): SARS Coronavirus 2: NEGATIVE

## 2020-01-21 ENCOUNTER — Other Ambulatory Visit: Payer: Self-pay

## 2020-01-21 ENCOUNTER — Ambulatory Visit
Admission: RE | Admit: 2020-01-21 | Discharge: 2020-01-21 | Disposition: A | Discharge status: Home / Self Care | Payer: Medicare Other | Source: Ambulatory Visit | Attending: General Surgery | Admitting: General Surgery

## 2020-01-21 DIAGNOSIS — Z170 Estrogen receptor positive status [ER+]: Secondary | ICD-10-CM

## 2020-01-21 DIAGNOSIS — C50912 Malignant neoplasm of unspecified site of left female breast: Secondary | ICD-10-CM | POA: Diagnosis not present

## 2020-01-21 DIAGNOSIS — C50412 Malignant neoplasm of upper-outer quadrant of left female breast: Secondary | ICD-10-CM

## 2020-01-22 ENCOUNTER — Encounter (HOSPITAL_BASED_OUTPATIENT_CLINIC_OR_DEPARTMENT_OTHER): Payer: Self-pay | Admitting: General Surgery

## 2020-01-22 ENCOUNTER — Ambulatory Visit (HOSPITAL_BASED_OUTPATIENT_CLINIC_OR_DEPARTMENT_OTHER)
Admission: RE | Admit: 2020-01-22 | Discharge: 2020-01-22 | Disposition: A | Discharge status: Home / Self Care | Payer: Medicare Other | Attending: General Surgery | Admitting: General Surgery

## 2020-01-22 ENCOUNTER — Ambulatory Visit (HOSPITAL_BASED_OUTPATIENT_CLINIC_OR_DEPARTMENT_OTHER): Payer: Medicare Other | Admitting: Certified Registered"

## 2020-01-22 ENCOUNTER — Encounter (HOSPITAL_BASED_OUTPATIENT_CLINIC_OR_DEPARTMENT_OTHER)
Admission: RE | Disposition: A | Discharge status: Home / Self Care | Payer: Self-pay | Source: Home / Self Care | Attending: General Surgery

## 2020-01-22 ENCOUNTER — Ambulatory Visit
Admission: RE | Admit: 2020-01-22 | Discharge: 2020-01-22 | Disposition: A | Discharge status: Home / Self Care | Payer: Medicare Other | Source: Ambulatory Visit | Attending: General Surgery | Admitting: General Surgery

## 2020-01-22 DIAGNOSIS — Z79899 Other long term (current) drug therapy: Secondary | ICD-10-CM | POA: Insufficient documentation

## 2020-01-22 DIAGNOSIS — C50912 Malignant neoplasm of unspecified site of left female breast: Secondary | ICD-10-CM | POA: Diagnosis not present

## 2020-01-22 DIAGNOSIS — Z170 Estrogen receptor positive status [ER+]: Secondary | ICD-10-CM

## 2020-01-22 DIAGNOSIS — C50412 Malignant neoplasm of upper-outer quadrant of left female breast: Secondary | ICD-10-CM | POA: Diagnosis not present

## 2020-01-22 DIAGNOSIS — E785 Hyperlipidemia, unspecified: Secondary | ICD-10-CM | POA: Diagnosis not present

## 2020-01-22 DIAGNOSIS — Z171 Estrogen receptor negative status [ER-]: Secondary | ICD-10-CM | POA: Diagnosis not present

## 2020-01-22 HISTORY — PX: BREAST LUMPECTOMY WITH RADIOACTIVE SEED LOCALIZATION: SHX6424

## 2020-01-22 SURGERY — BREAST LUMPECTOMY WITH RADIOACTIVE SEED LOCALIZATION
Anesthesia: General | Site: Breast | Laterality: Left

## 2020-01-22 MED ORDER — ACETAMINOPHEN 500 MG PO TABS
1000.0000 mg | ORAL_TABLET | ORAL | Status: AC
  Administered 2020-01-22: 1000 mg via ORAL

## 2020-01-22 MED ORDER — CELECOXIB 200 MG PO CAPS
200.0000 mg | ORAL_CAPSULE | ORAL | Status: AC
  Administered 2020-01-22: 200 mg via ORAL

## 2020-01-22 MED ORDER — CELECOXIB 200 MG PO CAPS
ORAL_CAPSULE | ORAL | Status: AC
  Filled 2020-01-22: qty 1

## 2020-01-22 MED ORDER — FENTANYL CITRATE (PF) 100 MCG/2ML IJ SOLN
INTRAMUSCULAR | Status: AC
  Filled 2020-01-22: qty 2

## 2020-01-22 MED ORDER — PROPOFOL 500 MG/50ML IV EMUL
INTRAVENOUS | Status: AC
  Filled 2020-01-22: qty 50

## 2020-01-22 MED ORDER — ACETAMINOPHEN 500 MG PO TABS
ORAL_TABLET | ORAL | Status: AC
  Filled 2020-01-22: qty 2

## 2020-01-22 MED ORDER — FENTANYL CITRATE (PF) 100 MCG/2ML IJ SOLN: 25.0000 ug | INTRAMUSCULAR | Status: DC | PRN

## 2020-01-22 MED ORDER — CEFAZOLIN SODIUM-DEXTROSE 2-4 GM/100ML-% IV SOLN
INTRAVENOUS | Status: AC
  Filled 2020-01-22: qty 100

## 2020-01-22 MED ORDER — ONDANSETRON HCL 4 MG/2ML IJ SOLN
INTRAMUSCULAR | Status: AC
  Filled 2020-01-22: qty 2

## 2020-01-22 MED ORDER — EPHEDRINE 5 MG/ML INJ
INTRAVENOUS | Status: AC
  Filled 2020-01-22: qty 10

## 2020-01-22 MED ORDER — MIDAZOLAM HCL 2 MG/2ML IJ SOLN
INTRAMUSCULAR | Status: AC
  Filled 2020-01-22: qty 2

## 2020-01-22 MED ORDER — LIDOCAINE HCL (CARDIAC) PF 100 MG/5ML IV SOSY
PREFILLED_SYRINGE | INTRAVENOUS | Status: DC | PRN
  Administered 2020-01-22: 60 mg via INTRAVENOUS

## 2020-01-22 MED ORDER — EPHEDRINE SULFATE 50 MG/ML IJ SOLN
INTRAMUSCULAR | Status: DC | PRN
  Administered 2020-01-22: 5 mg via INTRAVENOUS
  Administered 2020-01-22: 10 mg via INTRAVENOUS
  Administered 2020-01-22: 15 mg via INTRAVENOUS
  Administered 2020-01-22: 5 mg via INTRAVENOUS

## 2020-01-22 MED ORDER — BUPIVACAINE HCL (PF) 0.25 % IJ SOLN
INTRAMUSCULAR | Status: AC
  Filled 2020-01-22: qty 30

## 2020-01-22 MED ORDER — ONDANSETRON HCL 4 MG/2ML IJ SOLN
INTRAMUSCULAR | Status: DC | PRN
  Administered 2020-01-22: 4 mg via INTRAVENOUS

## 2020-01-22 MED ORDER — BUPIVACAINE HCL (PF) 0.25 % IJ SOLN
INTRAMUSCULAR | Status: DC | PRN
  Administered 2020-01-22: 20 mL

## 2020-01-22 MED ORDER — DEXAMETHASONE SODIUM PHOSPHATE 10 MG/ML IJ SOLN
INTRAMUSCULAR | Status: AC
  Filled 2020-01-22: qty 1

## 2020-01-22 MED ORDER — HYDROCODONE-ACETAMINOPHEN 5-325 MG PO TABS: 1.0000 | ORAL_TABLET | Freq: Four times a day (QID) | ORAL | 0 refills | Status: DC | PRN

## 2020-01-22 MED ORDER — CHLORHEXIDINE GLUCONATE CLOTH 2 % EX PADS: 6.0000 | MEDICATED_PAD | Freq: Once | CUTANEOUS | Status: DC

## 2020-01-22 MED ORDER — DEXAMETHASONE SODIUM PHOSPHATE 10 MG/ML IJ SOLN
INTRAMUSCULAR | Status: DC | PRN
  Administered 2020-01-22: 10 mg via INTRAVENOUS

## 2020-01-22 MED ORDER — OXYCODONE HCL 5 MG/5ML PO SOLN: 5.0000 mg | Freq: Once | ORAL | Status: DC | PRN

## 2020-01-22 MED ORDER — PROPOFOL 10 MG/ML IV BOLUS
INTRAVENOUS | Status: AC
  Filled 2020-01-22: qty 20

## 2020-01-22 MED ORDER — GABAPENTIN 300 MG PO CAPS
300.0000 mg | ORAL_CAPSULE | ORAL | Status: AC
  Administered 2020-01-22: 300 mg via ORAL

## 2020-01-22 MED ORDER — ONDANSETRON HCL 4 MG/2ML IJ SOLN: 4.0000 mg | Freq: Four times a day (QID) | INTRAMUSCULAR | Status: DC | PRN

## 2020-01-22 MED ORDER — CEFAZOLIN SODIUM-DEXTROSE 2-4 GM/100ML-% IV SOLN
2.0000 g | INTRAVENOUS | Status: AC
  Administered 2020-01-22: 2 g via INTRAVENOUS

## 2020-01-22 MED ORDER — LIDOCAINE 2% (20 MG/ML) 5 ML SYRINGE
INTRAMUSCULAR | Status: AC
  Filled 2020-01-22: qty 5

## 2020-01-22 MED ORDER — PROPOFOL 10 MG/ML IV BOLUS
INTRAVENOUS | Status: DC | PRN
  Administered 2020-01-22: 130 mg via INTRAVENOUS

## 2020-01-22 MED ORDER — FENTANYL CITRATE (PF) 100 MCG/2ML IJ SOLN
INTRAMUSCULAR | Status: DC | PRN
  Administered 2020-01-22 (×3): 25 ug via INTRAVENOUS

## 2020-01-22 MED ORDER — GABAPENTIN 300 MG PO CAPS
ORAL_CAPSULE | ORAL | Status: AC
  Filled 2020-01-22: qty 1

## 2020-01-22 MED ORDER — OXYCODONE HCL 5 MG PO TABS: 5.0000 mg | ORAL_TABLET | Freq: Once | ORAL | Status: DC | PRN

## 2020-01-22 MED ORDER — BUPIVACAINE-EPINEPHRINE (PF) 0.25% -1:200000 IJ SOLN: INTRAMUSCULAR | Status: DC | PRN

## 2020-01-22 MED ORDER — LACTATED RINGERS IV SOLN: INTRAVENOUS | Status: DC

## 2020-01-22 SURGICAL SUPPLY — 39 items
APPLIER CLIP 9.375 MED OPEN (MISCELLANEOUS) ×2
BLADE SURG 15 STRL LF DISP TIS (BLADE) ×1 IMPLANT
BLADE SURG 15 STRL SS (BLADE) ×2
CANISTER SUC SOCK COL 7IN (MISCELLANEOUS) IMPLANT
CANISTER SUCT 1200ML W/VALVE (MISCELLANEOUS) ×2 IMPLANT
CHLORAPREP W/TINT 26 (MISCELLANEOUS) ×2 IMPLANT
CLIP APPLIE 9.375 MED OPEN (MISCELLANEOUS) ×1 IMPLANT
COVER BACK TABLE 60X90IN (DRAPES) ×2 IMPLANT
COVER MAYO STAND STRL (DRAPES) ×2 IMPLANT
COVER PROBE W GEL 5X96 (DRAPES) ×2 IMPLANT
COVER WAND RF STERILE (DRAPES) IMPLANT
DECANTER SPIKE VIAL GLASS SM (MISCELLANEOUS) ×2 IMPLANT
DERMABOND ADVANCED (GAUZE/BANDAGES/DRESSINGS) ×1
DERMABOND ADVANCED .7 DNX12 (GAUZE/BANDAGES/DRESSINGS) ×1 IMPLANT
DRAPE LAPAROSCOPIC ABDOMINAL (DRAPES) ×2 IMPLANT
DRAPE UTILITY XL STRL (DRAPES) ×2 IMPLANT
ELECT COATED BLADE 2.86 ST (ELECTRODE) ×2 IMPLANT
ELECT REM PT RETURN 9FT ADLT (ELECTROSURGICAL) ×2
ELECTRODE REM PT RTRN 9FT ADLT (ELECTROSURGICAL) ×1 IMPLANT
GLOVE BIO SURGEON STRL SZ7.5 (GLOVE) ×4 IMPLANT
GOWN STRL REUS W/ TWL LRG LVL3 (GOWN DISPOSABLE) ×2 IMPLANT
GOWN STRL REUS W/TWL LRG LVL3 (GOWN DISPOSABLE) ×4
ILLUMINATOR WAVEGUIDE N/F (MISCELLANEOUS) IMPLANT
KIT MARKER MARGIN INK (KITS) ×2 IMPLANT
LIGHT WAVEGUIDE WIDE FLAT (MISCELLANEOUS) IMPLANT
NEEDLE HYPO 25X1 1.5 SAFETY (NEEDLE) ×2 IMPLANT
NS IRRIG 1000ML POUR BTL (IV SOLUTION) IMPLANT
PACK BASIN DAY SURGERY FS (CUSTOM PROCEDURE TRAY) ×2 IMPLANT
PENCIL SMOKE EVACUATOR (MISCELLANEOUS) ×2 IMPLANT
SLEEVE SCD COMPRESS KNEE MED (MISCELLANEOUS) ×2 IMPLANT
SPONGE LAP 18X18 RF (DISPOSABLE) ×2 IMPLANT
SUT MON AB 4-0 PC3 18 (SUTURE) ×4 IMPLANT
SUT SILK 2 0 SH (SUTURE) IMPLANT
SUT VICRYL 3-0 CR8 SH (SUTURE) ×2 IMPLANT
SYR CONTROL 10ML LL (SYRINGE) ×2 IMPLANT
TOWEL GREEN STERILE FF (TOWEL DISPOSABLE) ×2 IMPLANT
TRAY FAXITRON CT DISP (TRAY / TRAY PROCEDURE) ×2 IMPLANT
TUBE CONNECTING 20X1/4 (TUBING) ×2 IMPLANT
YANKAUER SUCT BULB TIP NO VENT (SUCTIONS) ×2 IMPLANT

## 2020-01-22 NOTE — Discharge Instructions (Signed)
*  No Ibuprofen until after 4pm *No tylenol until after 3pm  Post Anesthesia Home Care Instructions  Activity: Get plenty of rest for the remainder of the day. A responsible individual must stay with you for 24 hours following the procedure.  For the next 24 hours, DO NOT: -Drive a car -Paediatric nurse -Drink alcoholic beverages -Take any medication unless instructed by your physician -Make any legal decisions or sign important papers.  Meals: Start with liquid foods such as gelatin or soup. Progress to regular foods as tolerated. Avoid greasy, spicy, heavy foods. If nausea and/or vomiting occur, drink only clear liquids until the nausea and/or vomiting subsides. Call your physician if vomiting continues.  Special Instructions/Symptoms: Your throat may feel dry or sore from the anesthesia or the breathing tube placed in your throat during surgery. If this causes discomfort, gargle with warm salt water. The discomfort should disappear within 24 hours.  If you had a scopolamine patch placed behind your ear for the management of post- operative nausea and/or vomiting:  1. The medication in the patch is effective for 72 hours, after which it should be removed.  Wrap patch in a tissue and discard in the trash. Wash hands thoroughly with soap and water. 2. You may remove the patch earlier than 72 hours if you experience unpleasant side effects which may include dry mouth, dizziness or visual disturbances. 3. Avoid touching the patch. Wash your hands with soap and water after contact with the patch.

## 2020-01-22 NOTE — H&P (Signed)
Holly Leon  Location: [facility] Surgery Patient #: [id number] DOB: [birth year] Widowed / Language: Cleophus Molt / Race: White Female   History of Present Illness  The patient is a 84 year old female who presents with breast cancer. We are asked to see the patient in consultation by Dr. Shon Baton to evaluate her for a new left breast cancer. The patient is an 84 year old white female who recently went for a routine screening mammogram. At that time she was found to have a 9 mm area of asymmetry in the upper outer quadrant of the left breast. The lymph nodes looked normal. She is otherwise in good health and does not smoke. She has no family history of breast cancer. The cancer will again measured 9 mm and was ER positive and HER-2 positive with a Ki-67 of 70%.   Past Surgical History  No pertinent past surgical history   Diagnostic Studies History Colonoscopy  5-10 years ago  Allergies  Lipitor *ANTIHYPERLIPIDEMICS*   Medication History  Zocor (40MG Tablet, Oral) Active. Medications Reconciled  Social History  Caffeine use  Coffee. No alcohol use  No drug use  Tobacco use  Never smoker.  Family History  Heart Disease  Brother, Father, Mother.  Other Problems  No pertinent past medical history     Review of Systems  General Not Present- Appetite Loss, Chills, Fatigue, Fever, Night Sweats, Weight Gain and Weight Loss. Skin Not Present- Change in Wart/Mole, Dryness, Hives, Jaundice, New Lesions, Non-Healing Wounds, Rash and Ulcer. HEENT Not Present- Earache, Hearing Loss, Hoarseness, Nose Bleed, Oral Ulcers, Ringing in the Ears, Seasonal Allergies, Sinus Pain, Sore Throat, Visual Disturbances, Wears glasses/contact lenses and Yellow Eyes. Respiratory Not Present- Bloody sputum, Chronic Cough, Difficulty Breathing, Snoring and Wheezing. Cardiovascular Not Present- Chest Pain, Difficulty Breathing Lying Down, Leg Cramps, Palpitations, Rapid Heart  Rate, Shortness of Breath and Swelling of Extremities. Gastrointestinal Not Present- Abdominal Pain, Bloating, Bloody Stool, Change in Bowel Habits, Chronic diarrhea, Constipation, Difficulty Swallowing, Excessive gas, Gets full quickly at meals, Hemorrhoids, Indigestion, Nausea, Rectal Pain and Vomiting. Female Genitourinary Not Present- Frequency, Nocturia, Painful Urination, Pelvic Pain and Urgency. Musculoskeletal Not Present- Back Pain, Joint Pain, Joint Stiffness, Muscle Pain, Muscle Weakness and Swelling of Extremities. Neurological Not Present- Decreased Memory, Fainting, Headaches, Numbness, Seizures, Tingling, Tremor, Trouble walking and Weakness. Psychiatric Not Present- Anxiety, Bipolar, Change in Sleep Pattern, Depression, Fearful and Frequent crying. Endocrine Not Present- Cold Intolerance, Excessive Hunger, Hair Changes, Heat Intolerance, Hot flashes and New Diabetes. Hematology Not Present- Blood Thinners, Easy Bruising, Excessive bleeding, Gland problems, HIV and Persistent Infections.  Vitals  Weight: 137.5 lb Height: 62in Body Surface Area: 1.63 m Body Mass Index: 25.15 kg/m  Pulse: 100 (Regular)  BP: 156/84(Sitting, Left Arm, Standard)       Physical Exam  General Mental Status-Alert. General Appearance-Consistent with stated age. Hydration-Well hydrated. Voice-Normal.  Head and Neck Head-normocephalic, atraumatic with no lesions or palpable masses. Trachea-midline. Thyroid Gland Characteristics - normal size and consistency.  Eye Eyeball - Bilateral-Extraocular movements intact. Sclera/Conjunctiva - Bilateral-No scleral icterus.  Chest and Lung Exam Chest and lung exam reveals -quiet, even and easy respiratory effort with no use of accessory muscles and on auscultation, normal breath sounds, no adventitious sounds and normal vocal resonance. Inspection Chest Wall - Normal. Back - normal.  Breast Note: There is a palpable  bruise in the upper outer quadrant left breast. Other than this there is no palpable mass in either breast. There is no palpable axillary,  supraclavicular, or cervical lymphadenopathy.   Cardiovascular Cardiovascular examination reveals -normal heart sounds, regular rate and rhythm with no murmurs and normal pedal pulses bilaterally. Note: There is a slight systolic murmur   Abdomen Inspection Inspection of the abdomen reveals - No Hernias. Skin - Scar - no surgical scars. Palpation/Percussion Palpation and Percussion of the abdomen reveal - Soft, Non Tender, No Rebound tenderness, No Rigidity (guarding) and No hepatosplenomegaly. Auscultation Auscultation of the abdomen reveals - Bowel sounds normal.  Neurologic Neurologic evaluation reveals -alert and oriented x 3 with no impairment of recent or remote memory. Mental Status-Normal.  Musculoskeletal Normal Exam - Left-Upper Extremity Strength Normal and Lower Extremity Strength Normal. Normal Exam - Right-Upper Extremity Strength Normal and Lower Extremity Strength Normal.  Lymphatic Head & Neck  General Head & Neck Lymphatics: Bilateral - Description - Normal. Axillary  General Axillary Region: Bilateral - Description - Normal. Tenderness - Non Tender. Femoral & Inguinal  Generalized Femoral & Inguinal Lymphatics: Bilateral - Description - Normal. Tenderness - Non Tender.    Assessment & Plan MALIGNANT NEOPLASM OF UPPER-OUTER QUADRANT OF LEFT BREAST IN FEMALE, ESTROGEN RECEPTOR POSITIVE (C50.412) Impression: The patient appears to have a small stage I cancer in the upper outer quadrant of the left breast. We have discussed in detail the different options for treatment and at this point she favors breast conservation which I think is a reasonable way of treating this cancer. Given her age she will likely not need a node evaluation. I have discussed with her in detail the risks and benefits of the operation as well  as some of the technical aspects including the use of a radioactive seed for localization and she understands and wishes to proceed. I will go ahead and refer her to medical and radiation oncology to discuss adjuvant therapy. This patient encounter took 60 minutes today to perform the following: take history, perform exam, review outside records, interpret imaging, counsel the patient on their diagnosis and document encounter, findings & plan in the EHR Current Plans Referred to Oncology, for evaluation and follow up (Oncology). Routine.

## 2020-01-22 NOTE — Anesthesia Procedure Notes (Signed)
Procedure Name: LMA Insertion Date/Time: 01/22/2020 9:57 AM Performed by: Lavonia Dana, CRNA Pre-anesthesia Checklist: Patient identified, Emergency Drugs available, Suction available and Patient being monitored Patient Re-evaluated:Patient Re-evaluated prior to induction Oxygen Delivery Method: Circle system utilized Preoxygenation: Pre-oxygenation with 100% oxygen Induction Type: IV induction Ventilation: Mask ventilation without difficulty LMA: LMA inserted LMA Size: 4.0 Number of attempts: 1 Airway Equipment and Method: Bite block Placement Confirmation: positive ETCO2 Tube secured with: Tape Dental Injury: Teeth and Oropharynx as per pre-operative assessment

## 2020-01-22 NOTE — Anesthesia Preprocedure Evaluation (Signed)
Anesthesia Evaluation  Patient identified by MRN, date of birth, ID band Patient awake    Reviewed: Allergy & Precautions, H&P , NPO status , Patient's Chart, lab work & pertinent test results  Airway Mallampati: II   Neck ROM: full    Dental   Pulmonary neg pulmonary ROS,    breath sounds clear to auscultation       Cardiovascular  Rhythm:regular Rate:Normal  hyperlipidemia   Neuro/Psych    GI/Hepatic   Endo/Other    Renal/GU      Musculoskeletal   Abdominal   Peds  Hematology   Anesthesia Other Findings   Reproductive/Obstetrics Breast CA                             Anesthesia Physical Anesthesia Plan  ASA: II  Anesthesia Plan: General   Post-op Pain Management:    Induction: Intravenous  PONV Risk Score and Plan: 3 and Ondansetron, Dexamethasone and Treatment may vary due to age or medical condition  Airway Management Planned: LMA  Additional Equipment:   Intra-op Plan:   Post-operative Plan: Extubation in OR  Informed Consent: I have reviewed the patients History and Physical, chart, labs and discussed the procedure including the risks, benefits and alternatives for the proposed anesthesia with the patient or authorized representative who has indicated his/her understanding and acceptance.       Plan Discussed with: CRNA, Anesthesiologist and Surgeon  Anesthesia Plan Comments:         Anesthesia Quick Evaluation

## 2020-01-22 NOTE — Op Note (Signed)
01/22/2020  10:43 AM  PATIENT:  Holly Leon  84 y.o. female  PRE-OPERATIVE DIAGNOSIS:  LEFT BREAST CANCER  POST-OPERATIVE DIAGNOSIS:  LEFT BREAST CANCER  PROCEDURE:  Procedure(s): LEFT BREAST LUMPECTOMY WITH RADIOACTIVE SEED LOCALIZATION (Left)  SURGEON:  Surgeon(s) and Role:    * Jovita Kussmaul, MD - Primary  PHYSICIAN ASSISTANT:   ASSISTANTS: none   ANESTHESIA:   local and general  EBL:  5 mL   Leon ADMINISTERED:none  DRAINS: none   LOCAL MEDICATIONS USED:  MARCAINE     SPECIMEN:  Source of Specimen:  left breast tissue  DISPOSITION OF SPECIMEN:  PATHOLOGY  COUNTS:  YES  TOURNIQUET:  * No tourniquets in log *  DICTATION: .Dragon Dictation   After informed consent was obtained the patient was brought to the operating room and placed in the supine position on the operating table.  After adequate induction of general anesthesia the patient's left breast was prepped with ChloraPrep, allowed to dry, and draped in usual sterile manner.  An appropriate timeout was performed.  Previously an I-125 seed was placed in the lateral aspect of the left breast to mark an area of invasive breast cancer.  The neoprobe was set to I-125 in the area of radioactivity was readily identified.  The area around this was infiltrated with quarter percent Marcaine.  An elliptical incision was made in the skin overlying the area of radioactivity with a 15 blade knife.  The incision was carried through the skin and subcutaneous tissue sharply with the electrocautery.  Dissection was then carried out widely around the radioactive seed while checking the area of radioactivity frequently.  Once the specimen was removed from the patient it was then oriented with the appropriate paint colors.  A specimen radiograph was obtained that showed the clip and seed to be near the center of the specimen.  The specimen was then sent to pathology for further evaluation.  Hemostasis was achieved using the Bovie  electrocautery.  The wound was irrigated with saline and infiltrated with more quarter percent Marcaine.  The cavity was marked with clips.  The deep layer of the wound was then closed with layers of interrupted 3-0 Vicryl stitches.  The skin was closed with interrupted 4-0 Monocryl subcuticular stitches.  Dermabond dressings were applied.  The patient tolerated the procedure well.  At the end of the case all needle sponge and instrument counts were correct.  The patient was then awakened and taken to recovery in stable condition.  PLAN OF CARE: Discharge to home after PACU  PATIENT DISPOSITION:  PACU - hemodynamically stable.   Delay start of Pharmacological VTE agent (>24hrs) due to surgical Leon loss or risk of bleeding: not applicable

## 2020-01-22 NOTE — Interval H&P Note (Signed)
History and Physical Interval Note:  01/22/2020 9:01 AM  [name]  has presented today for surgery, with the diagnosis of LEFT BREAST CANCER.  The various methods of treatment have been discussed with the patient and family. After consideration of risks, benefits and other options for treatment, the patient has consented to  Procedure(s): LEFT BREAST LUMPECTOMY WITH RADIOACTIVE SEED LOCALIZATION (Left) as a surgical intervention.  The patient's history has been reviewed, patient examined, no change in status, stable for surgery.  I have reviewed the patient's chart and labs.  Questions were answered to the patient's satisfaction.     Holly Leon

## 2020-01-22 NOTE — Transfer of Care (Signed)
Immediate Anesthesia Transfer of Care Note  Patient: Holly Leon  Procedure(s) Performed: LEFT BREAST LUMPECTOMY WITH RADIOACTIVE SEED LOCALIZATION (Left Breast)  Patient Location: PACU  Anesthesia Type:General  Level of Consciousness: awake, alert  and oriented  Airway & Oxygen Therapy: Patient Spontanous Breathing and Patient connected to face mask oxygen  Post-op Assessment: Report given to RN and Post -op Vital signs reviewed and stable  Post vital signs: Reviewed and stable  Last Vitals:  Vitals Value Taken Time  BP 127/61 01/22/20 1053  Temp    Pulse 83 01/22/20 1054  Resp 17 01/22/20 1054  SpO2 99 % 01/22/20 1054  Vitals shown include unvalidated device data.  Last Pain:  Vitals:   01/22/20 0846  PainSc: 0-No pain      Patients Stated Pain Goal: 7 (40/39/79 [id number])  Complications: No complications documented.

## 2020-01-23 ENCOUNTER — Encounter (HOSPITAL_BASED_OUTPATIENT_CLINIC_OR_DEPARTMENT_OTHER): Payer: Self-pay | Admitting: General Surgery

## 2020-01-23 NOTE — Anesthesia Postprocedure Evaluation (Signed)
Anesthesia Post Note  Patient: Holly Leon  Procedure(s) Performed: LEFT BREAST LUMPECTOMY WITH RADIOACTIVE SEED LOCALIZATION (Left Breast)     Patient location during evaluation: PACU Anesthesia Type: General Level of consciousness: awake and alert Pain management: pain level controlled Vital Signs Assessment: post-procedure vital signs reviewed and stable Respiratory status: spontaneous breathing, nonlabored ventilation, respiratory function stable and patient connected to nasal cannula oxygen Cardiovascular status: blood pressure returned to baseline and stable Postop Assessment: no apparent nausea or vomiting Anesthetic complications: no   No complications documented.  Last Vitals:  Vitals:   01/22/20 1115 01/22/20 1137  BP: 122/61 133/68  Pulse: 84 88  Resp: 18 16  Temp:  (!) 36.1 C  SpO2: 94% 97%    Last Pain:  Vitals:   01/22/20 1137  TempSrc: Oral  PainSc: 0-No pain                 Iokepa Geffre S

## 2020-01-24 LAB — SURGICAL PATHOLOGY

## 2020-01-27 ENCOUNTER — Encounter: Payer: Self-pay | Admitting: *Deleted

## 2020-01-29 NOTE — Progress Notes (Signed)
Pharmacist Chemotherapy Monitoring - Initial Assessment    Anticipated start date: 02/04/20   Regimen:  . Are orders appropriate based on the patient's diagnosis, regimen, and cycle? Yes . Does the plan date match the patient's scheduled date? Yes . Is the sequencing of drugs appropriate? Yes . Are the premedications appropriate for the patient's regimen? Yes . Prior Authorization for treatment is: Approved o If applicable, is the correct biosimilar selected based on the patient's insurance? yes  Organ Function and Labs: Marland Kitchen Are dose adjustments needed based on the patient's renal function, hepatic function, or hematologic function? No . Are appropriate labs ordered prior to the start of patient's treatment? Yes . Other organ system assessment, if indicated: trastuzumab: Echo/ MUGA . The following baseline labs, if indicated, have been ordered: N/A  Dose Assessment: . Are the drug doses appropriate? Yes . Are the following correct: o Drug concentrations Yes o IV fluid compatible with drug Yes o Administration routes Yes o Timing of therapy Yes . If applicable, does the patient have documented access for treatment and/or plans for port-a-cath placement? no . If applicable, have lifetime cumulative doses been properly documented and assessed? yes Lifetime Dose Tracking  No doses have been documented on this patient for the following tracked chemicals: Doxorubicin, Epirubicin, Idarubicin, Daunorubicin, Mitoxantrone, Bleomycin, Oxaliplatin, Carboplatin, Liposomal Doxorubicin  o   Toxicity Monitoring/Prevention: . The patient has the following take home antiemetics prescribed: N/A . The patient has the following take home medications prescribed: N/A . Medication allergies and previous infusion related reactions, if applicable, have been reviewed and addressed. No . The patient's current medication list has been assessed for drug-drug interactions with their chemotherapy regimen. no  significant drug-drug interactions were identified on review.  Order Review: . Are the treatment plan orders signed? Yes . Is the patient scheduled to see a provider prior to their treatment? No  I verify that I have reviewed each item in the above checklist and answered each question accordingly.  Romualdo Bolk [facility] 01/29/2020 12:03 PM

## 2020-02-03 NOTE — Progress Notes (Signed)
[place]  [phone] [phone]     ID: Holly Leon DOB: Apr 08, 1935  MR#: [id number]  HWE#:[id number]  Patient Care Team: Shon Baton, MD as PCP - General (Internal Medicine) Jovita Kussmaul, MD as Consulting Physician (General Surgery) Kyung Rudd, MD as Consulting Physician (Radiation Oncology) Broady Lafoy, Holly Dad, MD as Consulting Physician (Oncology) Mauro Kaufmann, RN as Oncology Nurse Navigator Rockwell Germany, RN as Oncology Nurse Navigator Larey Dresser, MD as Consulting Physician (Cardiology) Chauncey Cruel, MD OTHER MD:  CHIEF COMPLAINT: HER-2 positive estrogen receptor positive breast cancer  CURRENT TREATMENT: adjuvant radiation   INTERVAL HISTORY: Holly Leon returns today for follow up of her Her2 positive breast cancer. She was evaluated in the breast cancer clinic on 12/10/2019.  She is accompanied by her close friend  Since consultation, she underwent left lumpectomy on 01/22/2020 under Dr. Marlou Starks. Pathology from the procedure [id number]) showed: invasive ductal carcinoma, 1 cm, grade 3; negative resection margins.   REVIEW OF SYSTEMS: Holly Leon did very well with her surgery, with no unusual pain, fever or bleeding problems.  She is pleased with the cosmetic result.  A detailed review of systems today was otherwise stable   COVID 19 VACCINATION STATUS: refuses vaccination   HISTORY OF CURRENT ILLNESS: From the original intake note:  Holly Leon had routine screening mammography on 09/30/2019 showing a possible abnormality in the left breast. She underwent left diagnostic mammography with tomography and left breast ultrasonography at [facility] on 10/18/2019 showing: breast density category B; indeterminate 9 mm left breast mass at 1:30; no axillary adenopathy.  Accordingly on 10/31/2019 she proceeded to biopsy of the left breast area in question. The pathology from this procedure ([id number]) showed:  fibrocystic changes. On post-biopsy mammogram, the biopsy clip was found to have migrated approximately 3.6 cm medially. She returned for repeat biopsy on 11/11/2019, with pathology ([id number]) showing: invasive mammary carcinoma, grade 3, e-cadherin positive. Prognostic indicators significant for: estrogen receptor, 80% positive with moderate staining intensity and progesterone receptor, 0% negative. Proliferation marker Ki67 at 70%. HER2 equivocal by immunohistochemistry (2+), but positive by fluorescent in situ hybridization with a signals ratio 2.83 and number per cell 5.65.  [name]   PAST MEDICAL HISTORY: Past Medical History:  Diagnosis Date  . Hyperlipidemia   . Osteoporosis   . Postural dizziness with presyncope     PAST SURGICAL HISTORY: Past Surgical History:  Procedure Laterality Date  . BREAST LUMPECTOMY WITH RADIOACTIVE SEED LOCALIZATION Left 01/22/2020   Procedure: LEFT BREAST LUMPECTOMY WITH RADIOACTIVE SEED LOCALIZATION;  Surgeon: Jovita Kussmaul, MD;  Location: [facility];  Service: General;  Laterality: Left;  . TONSILLECTOMY    . TUBAL LIGATION      FAMILY HISTORY: Family History  Problem Relation Age of Onset  . Colon cancer Neg Hx   . Esophageal cancer Neg Hx   . Rectal cancer Neg Hx   . Stomach cancer Neg Hx   The patient's father died at the age of 66 from heart disease. The patient's mother died at the age of 22 from heart disease. The patient has 1 brother who died at age 65 from heart disease. There have been some cancers on both sides of the family but the patient really does not have information regarding what type of cancers they were or when the diagnoses were made. She will try to get that information for [place]   GYNECOLOGIC HISTORY:  No  LMP recorded. Patient is postmenopausal. Menarche: 84 years old Age at first live birth: 84 years old [place] P 2 LMP early 47s Contraceptive early for several  years without complications HRT no Hysterectomy? no BSO? no   SOCIAL HISTORY: (updated 11/2019)  [name] is widowed and lives by herself with no pets. Her husband was in the [employer]. 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 Her son Holly Leon lives in [place] and her son Holly Leon lives in [place]. They both work in [employer]. The patient has no grandchildren. She attends [employer]    ADVANCED DIRECTIVES: Not in place. At the 12/10/2019 visit the patient was given the appropriate documents to complete and notarized at her discretion. She is planning to name her son Holly Leon as her healthcare power of attorney   HEALTH MAINTENANCE: Social History   Tobacco Use  . Smoking status: Never Smoker  . Smokeless tobacco: Never Used  Vaping Use  . Vaping Use: Never used  Substance Use Topics  . Alcohol use: No  . Drug use: No     Colonoscopy: 12/2016  PAP: none on file  Bone density: none on file   Allergies  Allergen Reactions  . Lipitor [Atorvastatin] Hives    Current Outpatient Medications  Medication Sig Dispense Refill  . aspirin 81 MG chewable tablet Chew by mouth daily.    . calcium citrate-vitamin D (CITRACAL+D) 315-200 MG-UNIT tablet Take 1 tablet by mouth 2 (two) times daily.    . Multiple Vitamins-Minerals (CENTRUM ADULTS PO) Take by mouth.    . simvastatin (ZOCOR) 40 MG tablet Take 40 mg by mouth daily.    . vitamin B-12 (CYANOCOBALAMIN) 500 MCG tablet Take 500 mcg by mouth daily.    . vitamin C (ASCORBIC ACID) 500 MG tablet Take 500 mg by mouth daily.    . vitamin E 400 UNIT capsule Take 400 Units by mouth daily.     No current facility-administered medications for this visit.    OBJECTIVE: White woman in no acute distress  Vitals:   02/04/20 0852  BP: (!) 141/74  Pulse: 82  Resp: 18  Temp: 97.6 F (36.4 C)  SpO2: 100%     Body mass index is 26.57 kg/m.   Wt Readings from Last 3  Encounters:  02/04/20 140 lb 9.6 oz (63.8 kg)  01/22/20 137 lb 12.6 oz (62.5 kg)  12/10/19 136 lb 4.8 oz (61.8 kg)      ECOG FS:1 - Symptomatic but completely ambulatory  Sclerae unicteric, EOMs intact Wearing a mask No cervical or supraclavicular adenopathy Lungs no rales or rhonchi Heart regular rate and rhythm Abd soft, nontender, positive bowel sounds MSK no focal spinal tenderness, no upper extremity lymphedema Neuro: nonfocal, well oriented, appropriate affect Breasts: The right breast is benign.  The left breast is status post recent lumpectomy.  The cosmetic result is good.  There is no dehiscence erythema or swelling.  Both axillae are benign.   LAB RESULTS:  CMP     Component Value Date/Time   NA 140 12/10/2019 1448   K 3.5 12/10/2019 1448   CL 107 12/10/2019 1448   CO2 26 12/10/2019 1448   GLUCOSE 99 12/10/2019 1448   BUN 11 12/10/2019 1448   CREATININE 0.87 12/10/2019 1448   CALCIUM 9.8 12/10/2019 1448   PROT 8.1 12/10/2019 1448   ALBUMIN 3.7 12/10/2019 1448   AST 16 12/10/2019 1448   ALT 15  12/10/2019 1448   ALKPHOS 86 12/10/2019 1448   BILITOT 0.5 12/10/2019 1448   GFRNONAA >60 12/10/2019 1448   GFRAA  03/24/2008 1642    >60        The eGFR has been calculated using the MDRD equation. This calculation has not been validated in all clinical situations. eGFR's persistently <60 mL/min signify possible Chronic Kidney Disease.    No results found for: TOTALPROTELP, ALBUMINELP, A1GS, A2GS, BETS, BETA2SER, GAMS, MSPIKE, SPEI  Lab Results  Component Value Date   WBC 6.0 02/04/2020   NEUTROABS 3.8 02/04/2020   HGB 14.4 02/04/2020   HCT 43.1 02/04/2020   MCV 88.7 02/04/2020   PLT 285 02/04/2020    No results found for: LABCA2  No components found for: JYNWGN562  No results for input(s): INR in the last 168 hours.  No results found for: LABCA2  No results found for: ZHY865  No results found for: HQI696  No results found for: EXB284  No  results found for: CA2729  No components found for: HGQUANT  No results found for: CEA1 / No results found for: CEA1   No results found for: AFPTUMOR  No results found for: CHROMOGRNA  No results found for: KPAFRELGTCHN, LAMBDASER, KAPLAMBRATIO (kappa/lambda light chains)  No results found for: HGBA, HGBA2QUANT, HGBFQUANT, HGBSQUAN (Hemoglobinopathy evaluation)   No results found for: LDH  No results found for: IRON, TIBC, IRONPCTSAT (Iron and TIBC)  No results found for: FERRITIN  Urinalysis No results found for: COLORURINE, APPEARANCEUR, LABSPEC, PHURINE, GLUCOSEU, HGBUR, BILIRUBINUR, KETONESUR, PROTEINUR, UROBILINOGEN, NITRITE, LEUKOCYTESUR   STUDIES: MM Breast Surgical Specimen  Result Date: 01/22/2020 CLINICAL DATA:  Evaluate surgical specimen following lumpectomy for LEFT breast cancer. EXAM: SPECIMEN RADIOGRAPH OF THE LEFT BREAST COMPARISON:  Previous exam(s). FINDINGS: Status post excision of the LEFT breast. The radioactive seed and X biopsy marker clip are visualized and intact. IMPRESSION: Specimen radiograph of the LEFT breast. Electronically Signed   By: Margarette Canada M.D.   On: 01/22/2020 10:37   MM LT RADIOACTIVE SEED LOC MAMMO GUIDE  Result Date: 01/21/2020 CLINICAL DATA:  84 year old female for radioactive seed localization of LEFT breast cancer prior to lumpectomy. EXAM: MAMMOGRAPHIC GUIDED RADIOACTIVE SEED LOCALIZATION OF THE LEFT BREAST COMPARISON:  Previous exam(s). FINDINGS: Patient presents for radioactive seed localization prior to LEFT lumpectomy. I met with the patient and we discussed the procedure of seed localization including benefits and alternatives. We discussed the high likelihood of a successful procedure. We discussed the risks of the procedure including infection, bleeding, tissue injury and further surgery. We discussed the low dose of radioactivity involved in the procedure. Informed, written consent was given. The usual time-out protocol was  performed immediately prior to the procedure. Using mammographic guidance, sterile technique, 1% lidocaine and an I-125 radioactive seed, the X shaped biopsy clip was localized using a LATERAL approach. The follow-up mammogram images confirm the seed in the expected location and were marked for Dr. Marlou Starks. Follow-up survey of the patient confirms presence of the radioactive seed. Order number of I-125 seed:  [id number]. Total activity:  7.253 millicuries.  Reference Date: 01/01/2020. The patient tolerated the procedure well and was [facility]. She was given instructions regarding seed removal. IMPRESSION: Radioactive seed localization LEFT breast. No apparent complications. Electronically Signed   By: Margarette Canada M.D.   On: 01/21/2020 13:25     ELIGIBLE FOR AVAILABLE RESEARCH PROTOCOL: no   ASSESSMENT: 84 y.o. [place] woman status post left breast upper outer  quadrant biopsy 11/11/2019 for a clinical T1b N0, stage IA invasive ductal carcinoma, grade 3, estrogen receptor positive, progesterone receptor negative, HER-2 amplified, with an MIB-1 of 70%  (1) s/p left lumpectomy w/o sentinel node sampling 01/22/2020 showed a pT1b cN0, stage IB invasive ductal carcinoma, grade 3, with negative margins  (2) anti-HER-2 immunotherapy starting 02/04/2020, to continue every 21 days for 1 year  (a) echo 01/02/2020 shows an EF of 70-75%  (3) adjuvant radiation pending  (4) antiestrogens to start at the completion of local treatment    PLAN:  Jolly did well with her surgery and is ready to start her anti-HER-2 treatment today.  We reviewed the fact that her cancer "has to mouth's", 1 for estrogen and 1 for her to, and that we need to stop both mouth so the cancer "stars".  She was able to understand this.  We again discussed the possible toxicities side effects and complications of trastuzumab particularly the possibility of a first dose reaction today, and a longer-term concern  regarding possibly weakening the heart muscle.  She will return in 3 weeks and we will make sure to see her that day just to make sure that she is tolerated the first treatment well.  I will see her in 6 weeks.  She will be getting on with radiation by that time and I will set her up for an echocardiogram before her fourth dose of trastuzumab  I encouraged her to call us with any problems that may develop before the next visit  Total encounter time 25 minutes.    Holly Leon. Jakki Doughty, MD 02/04/2020 9:08 AM Medical Oncology and Hematology [facility] [place], [place] [address] Tel. [phone]    Fax. [phone]   This document serves as a record of services personally performed by Lurline Del, MD. It was created on his behalf by Wilburn Mylar, a trained medical scribe. The creation of this record is based on the scribe's personal observations and the provider's statements to them.   I, Lurline Del MD, have reviewed the above documentation for accuracy and completeness, and I agree with the above.   *Total Encounter Time as defined by the Centers for Medicare and Medicaid Services includes, in addition to the face-to-face time of a patient visit (documented in the note above) non-face-to-face time: obtaining and reviewing outside history, ordering and reviewing medications, tests or procedures, care coordination (communications with other health care professionals or caregivers) and documentation in the medical record.

## 2020-02-04 ENCOUNTER — Encounter: Payer: Self-pay | Admitting: *Deleted

## 2020-02-04 ENCOUNTER — Inpatient Hospital Stay (HOSPITAL_BASED_OUTPATIENT_CLINIC_OR_DEPARTMENT_OTHER): Payer: Medicare Other | Admitting: Oncology

## 2020-02-04 ENCOUNTER — Inpatient Hospital Stay: Payer: Medicare Other | Attending: Oncology

## 2020-02-04 ENCOUNTER — Inpatient Hospital Stay: Payer: Medicare Other

## 2020-02-04 ENCOUNTER — Other Ambulatory Visit: Payer: Self-pay

## 2020-02-04 VITALS — BP 141/74 | HR 82 | Temp 97.6°F | Resp 18 | Ht 61.0 in | Wt 140.6 lb

## 2020-02-04 DIAGNOSIS — E785 Hyperlipidemia, unspecified: Secondary | ICD-10-CM | POA: Diagnosis not present

## 2020-02-04 DIAGNOSIS — Z79899 Other long term (current) drug therapy: Secondary | ICD-10-CM | POA: Insufficient documentation

## 2020-02-04 DIAGNOSIS — Z170 Estrogen receptor positive status [ER+]: Secondary | ICD-10-CM

## 2020-02-04 DIAGNOSIS — Z7982 Long term (current) use of aspirin: Secondary | ICD-10-CM | POA: Insufficient documentation

## 2020-02-04 DIAGNOSIS — C50412 Malignant neoplasm of upper-outer quadrant of left female breast: Secondary | ICD-10-CM

## 2020-02-04 DIAGNOSIS — Z8249 Family history of ischemic heart disease and other diseases of the circulatory system: Secondary | ICD-10-CM | POA: Insufficient documentation

## 2020-02-04 DIAGNOSIS — Z5112 Encounter for antineoplastic immunotherapy: Secondary | ICD-10-CM | POA: Diagnosis not present

## 2020-02-04 DIAGNOSIS — M810 Age-related osteoporosis without current pathological fracture: Secondary | ICD-10-CM | POA: Insufficient documentation

## 2020-02-04 LAB — CBC WITH DIFFERENTIAL/PLATELET
Abs Immature Granulocytes: 0.02 10*3/uL (ref 0.00–0.07)
Basophils Absolute: 0.1 10*3/uL (ref 0.0–0.1)
Basophils Relative: 1 %
Eosinophils Absolute: 0.2 10*3/uL (ref 0.0–0.5)
Eosinophils Relative: 4 %
HCT: 43.1 % (ref 36.0–46.0)
Hemoglobin: 14.4 g/dL (ref 12.0–15.0)
Immature Granulocytes: 0 %
Lymphocytes Relative: 22 %
Lymphs Abs: 1.3 10*3/uL (ref 0.7–4.0)
MCH: 29.6 pg (ref 26.0–34.0)
MCHC: 33.4 g/dL (ref 30.0–36.0)
MCV: 88.7 fL (ref 80.0–100.0)
Monocytes Absolute: 0.6 10*3/uL (ref 0.1–1.0)
Monocytes Relative: 9 %
Neutro Abs: 3.8 10*3/uL (ref 1.7–7.7)
Neutrophils Relative %: 64 %
Platelets: 285 10*3/uL (ref 150–400)
RBC: 4.86 MIL/uL (ref 3.87–5.11)
RDW: 12.8 % (ref 11.5–15.5)
WBC: 6 10*3/uL (ref 4.0–10.5)
nRBC: 0 % (ref 0.0–0.2)

## 2020-02-04 LAB — COMPREHENSIVE METABOLIC PANEL
ALT: 23 U/L (ref 0–44)
AST: 17 U/L (ref 15–41)
Albumin: 3.8 g/dL (ref 3.5–5.0)
Alkaline Phosphatase: 80 U/L (ref 38–126)
Anion gap: 9 (ref 5–15)
BUN: 14 mg/dL (ref 8–23)
CO2: 22 mmol/L (ref 22–32)
Calcium: 9.7 mg/dL (ref 8.9–10.3)
Chloride: 109 mmol/L (ref 98–111)
Creatinine, Ser: 0.91 mg/dL (ref 0.44–1.00)
GFR, Estimated: >60 mL/min (ref >60)
Glucose, Bld: 107 mg/dL — ABNORMAL HIGH (ref 70–99)
Potassium: 3.9 mmol/L (ref 3.5–5.1)
Sodium: 140 mmol/L (ref 135–145)
Total Bilirubin: 0.5 mg/dL (ref 0.3–1.2)
Total Protein: 8.1 g/dL (ref 6.5–8.1)

## 2020-02-04 MED ORDER — DIPHENHYDRAMINE HCL 25 MG PO CAPS
ORAL_CAPSULE | ORAL | Status: AC
  Filled 2020-02-04: qty 1

## 2020-02-04 MED ORDER — ACETAMINOPHEN 325 MG PO TABS
650.0000 mg | ORAL_TABLET | Freq: Once | ORAL | Status: AC
  Administered 2020-02-04: 650 mg via ORAL

## 2020-02-04 MED ORDER — DIPHENHYDRAMINE HCL 25 MG PO CAPS
25.0000 mg | ORAL_CAPSULE | Freq: Once | ORAL | Status: AC
  Administered 2020-02-04: 25 mg via ORAL

## 2020-02-04 MED ORDER — TRASTUZUMAB-DKST CHEMO 150 MG IV SOLR
8.0000 mg/kg | Freq: Once | INTRAVENOUS | Status: AC
  Administered 2020-02-04: 504 mg via INTRAVENOUS
  Filled 2020-02-04: qty 24

## 2020-02-04 MED ORDER — SODIUM CHLORIDE 0.9 % IV SOLN
Freq: Once | INTRAVENOUS | Status: AC
Start: 2020-02-04 — End: 2020-02-04
  Filled 2020-02-04: qty 250

## 2020-02-04 MED ORDER — ACETAMINOPHEN 325 MG PO TABS
ORAL_TABLET | ORAL | Status: AC
  Filled 2020-02-04: qty 2

## 2020-02-04 NOTE — Patient Instructions (Signed)
[facility] Discharge Instructions for Patients Receiving Chemotherapy  Today you received the following chemotherapy agents Trastuzumab dkst  To help prevent nausea and vomiting after your treatment, we encourage you to take your nausea medication as directed   If you develop nausea and vomiting that is not controlled by your nausea medication, call the clinic.   BELOW ARE SYMPTOMS THAT SHOULD BE REPORTED IMMEDIATELY:  *FEVER GREATER THAN 100.5 F  *CHILLS WITH OR WITHOUT FEVER  NAUSEA AND VOMITING THAT IS NOT CONTROLLED WITH YOUR NAUSEA MEDICATION  *UNUSUAL SHORTNESS OF BREATH  *UNUSUAL BRUISING OR BLEEDING  TENDERNESS IN MOUTH AND THROAT WITH OR WITHOUT PRESENCE OF ULCERS  *URINARY PROBLEMS  *BOWEL PROBLEMS  UNUSUAL RASH Items with * indicate a potential emergency and should be followed up as soon as possible.  Feel free to call the clinic should you have any questions or concerns. The clinic phone number is [phone].  Please show the [facility] at check-in to the Emergency Department and triage nurse.   Trastuzumab injection for infusion What is this medicine? TRASTUZUMAB (tras TOO zoo mab) is a monoclonal antibody. It is used to treat breast cancer and stomach cancer. This medicine may be used for other purposes; ask your health care provider or pharmacist if you have questions. COMMON BRAND NAME(S): Herceptin, Galvin Proffer, Trazimera What should I tell my health care provider before I take this medicine? They need to know if you have any of these conditions:  heart disease  heart failure  lung or breathing disease, like asthma  an unusual or allergic reaction to trastuzumab, benzyl alcohol, or other medications, foods, dyes, or preservatives  pregnant or trying to get pregnant  breast-feeding How should I use this medicine? This drug is given as an infusion into a vein. It is administered in a hospital or  clinic by a specially trained health care professional. Talk to your pediatrician regarding the use of this medicine in children. This medicine is not approved for use in children. Overdosage: If you think you have taken too much of this medicine contact a poison control center or emergency room at once. NOTE: This medicine is only for you. Do not share this medicine with others. What if I miss a dose? It is important not to miss a dose. Call your doctor or health care professional if you are unable to keep an appointment. What may interact with this medicine? This medicine may interact with the following medications:  certain types of chemotherapy, such as daunorubicin, doxorubicin, epirubicin, and idarubicin This list may not describe all possible interactions. Give your health care provider a list of all the medicines, herbs, non-prescription drugs, or dietary supplements you use. Also tell them if you smoke, drink alcohol, or use illegal drugs. Some items may interact with your medicine. What should I watch for while using this medicine? Visit your doctor for checks on your progress. Report any side effects. Continue your course of treatment even though you feel ill unless your doctor tells you to stop. Call your doctor or health care professional for advice if you get a fever, chills or sore throat, or other symptoms of a cold or flu. Do not treat yourself. Try to avoid being around people who are sick. You may experience fever, chills and shaking during your first infusion. These effects are usually mild and can be treated with other medicines. Report any side effects during the infusion to your health care professional. Fever  and chills usually do not happen with later infusions. Do not become pregnant while taking this medicine or for 7 months after stopping it. Women should inform their doctor if they wish to become pregnant or think they might be pregnant. Women of child-bearing potential  will need to have a negative pregnancy test before starting this medicine. There is a potential for serious side effects to an unborn child. Talk to your health care professional or pharmacist for more information. Do not breast-feed an infant while taking this medicine or for 7 months after stopping it. Women must use effective birth control with this medicine. What side effects may I notice from receiving this medicine? Side effects that you should report to your doctor or health care professional as soon as possible:  allergic reactions like skin rash, itching or hives, swelling of the face, lips, or tongue  chest pain or palpitations  cough  dizziness  feeling faint or lightheaded, falls  fever  general ill feeling or flu-like symptoms  signs of worsening heart failure like breathing problems; swelling in your legs and feet  unusually weak or tired Side effects that usually do not require medical attention (report to your doctor or health care professional if they continue or are bothersome):  bone pain  changes in taste  diarrhea  joint pain  nausea/vomiting  weight loss This list may not describe all possible side effects. Call your doctor for medical advice about side effects. You may report side effects to FDA at 1-800-FDA-1088. Where should I keep my medicine? This drug is given in a hospital or clinic and will not be stored at home. NOTE: This sheet is a summary. It may not cover all possible information. If you have questions about this medicine, talk to your doctor, pharmacist, or health care provider.  2020 Elsevier/Gold Standard (2016-02-02 14:37:52)

## 2020-02-25 ENCOUNTER — Encounter: Payer: Self-pay | Admitting: Radiation Oncology

## 2020-02-25 ENCOUNTER — Ambulatory Visit
Admission: RE | Admit: 2020-02-25 | Discharge: 2020-02-25 | Disposition: A | Discharge status: Home / Self Care | Payer: Medicare Other | Source: Ambulatory Visit | Attending: Radiation Oncology | Admitting: Radiation Oncology

## 2020-02-25 ENCOUNTER — Other Ambulatory Visit: Payer: Self-pay | Admitting: Radiation Oncology

## 2020-02-25 ENCOUNTER — Inpatient Hospital Stay: Payer: Medicare Other

## 2020-02-25 ENCOUNTER — Other Ambulatory Visit: Payer: Self-pay

## 2020-02-25 ENCOUNTER — Inpatient Hospital Stay: Payer: Medicare Other | Attending: Oncology

## 2020-02-25 ENCOUNTER — Inpatient Hospital Stay (HOSPITAL_BASED_OUTPATIENT_CLINIC_OR_DEPARTMENT_OTHER): Payer: Medicare Other | Admitting: Medical

## 2020-02-25 VITALS — BP 160/69 | HR 76 | Temp 97.9°F | Resp 18 | Ht 61.0 in | Wt 139.6 lb

## 2020-02-25 VITALS — BP 157/71 | HR 81

## 2020-02-25 VITALS — BP 167/95 | HR 86 | Temp 97.0°F | Resp 16 | Ht 61.0 in | Wt 139.6 lb

## 2020-02-25 DIAGNOSIS — M810 Age-related osteoporosis without current pathological fracture: Secondary | ICD-10-CM | POA: Insufficient documentation

## 2020-02-25 DIAGNOSIS — Z79899 Other long term (current) drug therapy: Secondary | ICD-10-CM | POA: Insufficient documentation

## 2020-02-25 DIAGNOSIS — E785 Hyperlipidemia, unspecified: Secondary | ICD-10-CM | POA: Diagnosis not present

## 2020-02-25 DIAGNOSIS — Z170 Estrogen receptor positive status [ER+]: Secondary | ICD-10-CM | POA: Insufficient documentation

## 2020-02-25 DIAGNOSIS — Z5112 Encounter for antineoplastic immunotherapy: Secondary | ICD-10-CM | POA: Diagnosis not present

## 2020-02-25 DIAGNOSIS — C50412 Malignant neoplasm of upper-outer quadrant of left female breast: Secondary | ICD-10-CM

## 2020-02-25 HISTORY — DX: Personal history of malignant neoplasm of breast: Z85.3

## 2020-02-25 LAB — CBC WITH DIFFERENTIAL/PLATELET
Abs Immature Granulocytes: 0.02 10*3/uL (ref 0.00–0.07)
Basophils Absolute: 0.1 10*3/uL (ref 0.0–0.1)
Basophils Relative: 1 %
Eosinophils Absolute: 0.3 10*3/uL (ref 0.0–0.5)
Eosinophils Relative: 4 %
HCT: 41.6 % (ref 36.0–46.0)
Hemoglobin: 14 g/dL (ref 12.0–15.0)
Immature Granulocytes: 0 %
Lymphocytes Relative: 20 %
Lymphs Abs: 1.2 10*3/uL (ref 0.7–4.0)
MCH: 30.1 pg (ref 26.0–34.0)
MCHC: 33.7 g/dL (ref 30.0–36.0)
MCV: 89.5 fL (ref 80.0–100.0)
Monocytes Absolute: 0.6 10*3/uL (ref 0.1–1.0)
Monocytes Relative: 10 %
Neutro Abs: 4 10*3/uL (ref 1.7–7.7)
Neutrophils Relative %: 65 %
Platelets: 278 10*3/uL (ref 150–400)
RBC: 4.65 MIL/uL (ref 3.87–5.11)
RDW: 13.4 % (ref 11.5–15.5)
WBC: 6.1 10*3/uL (ref 4.0–10.5)
nRBC: 0 % (ref 0.0–0.2)

## 2020-02-25 MED ORDER — SODIUM CHLORIDE 0.9 % IV SOLN
Freq: Once | INTRAVENOUS | Status: AC
  Filled 2020-02-25: qty 250

## 2020-02-25 MED ORDER — DIPHENHYDRAMINE HCL 25 MG PO CAPS
25.0000 mg | ORAL_CAPSULE | Freq: Once | ORAL | Status: AC
  Administered 2020-02-25: 25 mg via ORAL

## 2020-02-25 MED ORDER — ACETAMINOPHEN 325 MG PO TABS
650.0000 mg | ORAL_TABLET | Freq: Once | ORAL | Status: AC
  Administered 2020-02-25: 650 mg via ORAL

## 2020-02-25 MED ORDER — TRASTUZUMAB-DKST CHEMO 150 MG IV SOLR
6.0000 mg/kg | Freq: Once | INTRAVENOUS | Status: AC
  Administered 2020-02-25: 378 mg via INTRAVENOUS
  Filled 2020-02-25: qty 18

## 2020-02-25 MED ORDER — DIPHENHYDRAMINE HCL 25 MG PO CAPS
ORAL_CAPSULE | ORAL | Status: AC
  Filled 2020-02-25: qty 1

## 2020-02-25 MED ORDER — ACETAMINOPHEN 325 MG PO TABS
ORAL_TABLET | ORAL | Status: AC
  Filled 2020-02-25: qty 2

## 2020-02-25 NOTE — Progress Notes (Unsigned)
[facility] Progress Note   Holly Leon [id number] 26-Mar-1935 85 y.o.  Holly Leon is managed by Dr. Lurline Del  Actively treated with chemotherapy/immunotherapy/hormonal therapy: yes  Current therapy: Ogivri  Last treated: 01/25/2020 (cycle #1, day #1)  Next scheduled appointment with provider: 04/07/2020  Assessment: Plan:    Malignant neoplasm of upper-outer quadrant of left breast in female, estrogen receptor positive ([facility])   ER positive left breast cancer: [name] presents for cycle #2, day #1 of Ogivri today. We will proceed with her treatment today and will have her for follow up on 04/07/2020. She is pending a CT simulation on 02/27/2020 for start of radiation.   Please see After Visit Summary for patient specific instructions.  Future Appointments  Date Time Provider [place]  03/09/2020  8:00 AM Kyung Rudd, MD [facility] None  03/10/2020  8:00 AM [facility] [id number] [facility] None  03/11/2020  9:45 AM [facility] [id number] [facility] None  03/12/2020  9:45 AM [facility] [id number] [facility] None  03/13/2020 11:15 AM [facility] [id number] [facility] None  03/16/2020  9:00 AM [facility] LINAC 3 [facility] None  03/17/2020  8:00 AM [facility] LAB [facility] None  03/17/2020  8:45 AM [facility] INFUSION [facility] None  03/17/2020 11:30 AM [facility] [id number] [facility] None  03/18/2020  9:45 AM [facility] [id number] [facility] None  03/19/2020  9:45 AM [facility] [id number] [facility] None  03/20/2020 10:45 AM [facility] [id number] [facility] None  03/23/2020 10:45 AM [facility] [id number] [facility] None  03/24/2020 10:00 AM [facility] [id number] [facility] None  03/25/2020 10:00 AM [facility] [id number] [facility] None  03/26/2020 10:00 AM [facility] [id number] [facility] None  03/27/2020 10:30 AM [facility] [id number] [facility] None  03/27/2020 10:45 AM Kyung Rudd, MD [facility] None  03/30/2020 10:00 AM  [facility] [id number] [facility] None  03/31/2020 10:00 AM [facility] [id number] [facility] None  04/01/2020 10:00 AM [facility] [id number] [facility] None  04/02/2020 10:00 AM [facility] [id number] [facility] None  04/03/2020 10:00 AM [facility] [id number] [facility] None  04/07/2020  8:00 AM [facility] LAB [facility] None  04/07/2020  8:30 AM Magrinat, Virgie Dad, MD [facility] None  04/07/2020  9:15 AM [facility] INFUSION [facility] None  04/28/2020  8:00 AM [facility] LAB [facility] None  04/28/2020  8:45 AM [facility] INFUSION [facility] None    No orders of the defined types were placed in this encounter.      Subjective:   Patient ID:  Holly Leon is a 85 y.o. (DOB 06/06/1935) female.  Chief Complaint: No chief complaint on file.   HPI Holly Leon  is a 85 y.o. female with a diagnosis of an ER positive left breast cancer. She presents for cycle #2, day #1 of Ogivri today.  She reports that she is doing well.  She denies any issues of concern.  She continues to tolerate her treatments without any ill effects.  She is pending a CT simulation on 02/27/2020 for start of radiation.    Medications: I have reviewed the patient's current medications.  Allergies:  Allergies  Allergen Reactions  . Lipitor [Atorvastatin] Hives    Past Medical History:  Diagnosis Date  . History of left breast cancer   . Hyperlipidemia   . Osteoporosis   . Postural dizziness with presyncope     Past Surgical History:  Procedure Laterality Date  . BREAST LUMPECTOMY WITH RADIOACTIVE SEED LOCALIZATION Left 01/22/2020   Procedure: LEFT BREAST LUMPECTOMY WITH RADIOACTIVE SEED LOCALIZATION;  Surgeon: Jovita Kussmaul, MD;  Location: [facility];  Service: General;  Laterality: Left;  . TONSILLECTOMY    .  TUBAL LIGATION      Family History  Problem Relation Age of Onset  . Colon cancer Neg Hx   . Esophageal cancer Neg Hx   . Rectal cancer Neg Hx   . Stomach  cancer Neg Hx     Social History   Socioeconomic History  . Marital status: Widowed    Spouse name: Not on file  . Number of children: Not on file  . Years of education: Not on file  . Highest education level: Not on file  Occupational History  . Not on file  Tobacco Use  . Smoking status: Never Smoker  . Smokeless tobacco: Never Used  Vaping Use  . Vaping Use: Never used  Substance and Sexual Activity  . Alcohol use: No  . Drug use: No  . Sexual activity: Not on file  Other Topics Concern  . Not on file  Social History Narrative  . Not on file   Social Determinants of Health   Financial Resource Strain: Not on file  Food Insecurity: Not on file  Transportation Needs: Not on file  Physical Activity: Not on file  Stress: Not on file  Social Connections: Not on file  Intimate Partner Violence: Not on file    Past Medical History, Surgical history, Social history, and Family history were reviewed and updated as appropriate.   Please see review of systems for further details on the patient's review from today.   Review of Systems:  Review of Systems  Constitutional: Negative for chills, diaphoresis and fever.  HENT: Negative for trouble swallowing and voice change.   Respiratory: Negative for cough, chest tightness, shortness of breath and wheezing.   Cardiovascular: Negative for chest pain and palpitations.  Gastrointestinal: Negative for abdominal pain, constipation, diarrhea, nausea and vomiting.  Musculoskeletal: Negative for back pain and myalgias.  Neurological: Negative for dizziness, light-headedness and headaches.    Objective:   Physical Exam:  BP (!) 167/95 (BP Location: Right Arm, Patient Position: Sitting)   Pulse 86   Temp (!) 97 F (36.1 C) (Tympanic)   Resp 16   Ht '5\' 1"'  (1.549 m)   Wt 139 lb 9.6 oz (63.3 kg)   SpO2 99%   BMI 26.38 kg/m  ECOG: 0  Physical Exam Constitutional:      General: She is not in acute distress.    Appearance:  She is not diaphoretic.  HENT:     Head: Normocephalic and atraumatic.  Eyes:     General: No scleral icterus.       Right eye: No discharge.        Left eye: No discharge.     Conjunctiva/sclera: Conjunctivae normal.  Cardiovascular:     Rate and Rhythm: Normal rate and regular rhythm.     Heart sounds: Normal heart sounds. No murmur heard. No friction rub. No gallop.   Pulmonary:     Effort: Pulmonary effort is normal. No respiratory distress.     Breath sounds: Normal breath sounds. No wheezing or rales.  Skin:    General: Skin is warm and dry.     Findings: No erythema or rash.  Neurological:     Mental Status: She is alert.     Coordination: Coordination normal.     Gait: Gait normal.     Lab Review:     Component Value Date/Time   NA 140 02/04/2020 0836   K 3.9 02/04/2020 0836   CL 109 02/04/2020 0836   CO2 22 02/04/2020 0836  GLUCOSE 107 (H) 02/04/2020 0836   BUN 14 02/04/2020 0836   CREATININE 0.91 02/04/2020 0836   CREATININE 0.87 12/10/2019 1448   CALCIUM 9.7 02/04/2020 0836   PROT 8.1 02/04/2020 0836   ALBUMIN 3.8 02/04/2020 0836   AST 17 02/04/2020 0836   AST 16 12/10/2019 1448   ALT 23 02/04/2020 0836   ALT 15 12/10/2019 1448   ALKPHOS 80 02/04/2020 0836   BILITOT 0.5 02/04/2020 0836   BILITOT 0.5 12/10/2019 1448   GFRNONAA >60 02/04/2020 0836   GFRNONAA >60 12/10/2019 1448   GFRAA  03/24/2008 1642    >60        The eGFR has been calculated using the MDRD equation. This calculation has not been validated in all clinical situations. eGFR's persistently <60 mL/min signify possible Chronic Kidney Disease.       Component Value Date/Time   WBC 6.1 02/25/2020 1322   RBC 4.65 02/25/2020 1322   HGB 14.0 02/25/2020 1322   HGB 13.5 12/10/2019 1448   HCT 41.6 02/25/2020 1322   PLT 278 02/25/2020 1322   PLT 317 12/10/2019 1448   MCV 89.5 02/25/2020 1322   MCH 30.1 02/25/2020 1322   MCHC 33.7 02/25/2020 1322   RDW 13.4 02/25/2020 1322    LYMPHSABS 1.2 02/25/2020 1322   MONOABS 0.6 02/25/2020 1322   EOSABS 0.3 02/25/2020 1322   BASOSABS 0.1 02/25/2020 1322   -------------------------------  Imaging from last 24 hours (if applicable):  Radiology interpretation: No results found.    OK to treat.  Sandi Mealy, MHS, PA-C Physician Assistant

## 2020-02-25 NOTE — Patient Instructions (Signed)
[facility] Discharge Instructions for Patients Receiving Chemotherapy  Today you received the following immunotherapy agent: Traztuzumab  To help prevent nausea and vomiting after your treatment, we encourage you to take your nausea medication as directed by your MD.   If you develop nausea and vomiting that is not controlled by your nausea medication, call the clinic.   BELOW ARE SYMPTOMS THAT SHOULD BE REPORTED IMMEDIATELY:  *FEVER GREATER THAN 100.5 F  *CHILLS WITH OR WITHOUT FEVER  NAUSEA AND VOMITING THAT IS NOT CONTROLLED WITH YOUR NAUSEA MEDICATION  *UNUSUAL SHORTNESS OF BREATH  *UNUSUAL BRUISING OR BLEEDING  TENDERNESS IN MOUTH AND THROAT WITH OR WITHOUT PRESENCE OF ULCERS  *URINARY PROBLEMS  *BOWEL PROBLEMS  UNUSUAL RASH Items with * indicate a potential emergency and should be followed up as soon as possible.  Feel free to call the clinic should you have any questions or concerns. The clinic phone number is [phone].  Please show the CHEMO ALERT CARD at check-in to the Emergency Department and triage nurse.

## 2020-02-25 NOTE — Progress Notes (Signed)
Radiation Oncology         [phone] ________________________________  Outpatient Follow Up   Name: Holly Leon        MRN: [id number]  Date of Service: 02/25/2020 DOB: 01/10/1936  CC:Russo, John, MD  Magrinat, Gustav C, MD     REFERRING PHYSICIAN: Magrinat, Gustav C, MD   DIAGNOSIS: The encounter diagnosis was Malignant neoplasm of upper-outer quadrant of left breast in female, estrogen receptor positive ([facility]).   HISTORY OF PRESENT ILLNESS: Holly Leon is a 84 y.o. female with a  new diagnosis of left breast cancer. The patient was noted to have a possible asymmetry on screening mammography in the left breast in August 2021.  Further diagnostic imaging revealed a small indeterminate mass at 1:30 position in the left breast, ultrasound measured this at 9 x 8 x 7 mm.  No adenopathy was identified she subsequently underwent ultrasound-guided biopsy on 10/31/19 that revealed fibrocystic changes at the 1:30 position.  This was felt to be discordant, and repeat biopsy on 11/11/2019 revealed an invasive mammary carcinoma grade 3 with E-cadherin positive consistent with a ductal carcinoma, her tumor was ER positive, PR negative, HER-2 was positive by FISH, and her Ki-67 was 70%.  Since her last visit she did undergo rebiopsy of a shaddow focus not seen on her initial imaging, this was negative, she subsequently underwent lumpectomy on 01/22/2020 an invasive ductal carcinoma measuring 1 cm was noted that was grade 3, no nodes were sampled, her margins were clear, and HER-2 status was amplified so so she will receive anti-HER-2 therapy but does not plan to have chemotherapy along with this.  No additional surgery is planned, and she is seen today to discuss adjuvant radiotherapy.    PREVIOUS RADIATION THERAPY: No   PAST MEDICAL HISTORY:  Past Medical History:  Diagnosis Date  . Hyperlipidemia   . Osteoporosis   . Postural dizziness with presyncope        PAST SURGICAL  HISTORY: Past Surgical History:  Procedure Laterality Date  . BREAST LUMPECTOMY WITH RADIOACTIVE SEED LOCALIZATION Left 01/22/2020   Procedure: LEFT BREAST LUMPECTOMY WITH RADIOACTIVE SEED LOCALIZATION;  Surgeon: Toth, Paul III, MD;  Location: [facility];  Service: General;  Laterality: Left;  . TONSILLECTOMY    . TUBAL LIGATION       FAMILY HISTORY:  Family History  Problem Relation Age of Onset  . Colon cancer Neg Hx   . Esophageal cancer Neg Hx   . Rectal cancer Neg Hx   . Stomach cancer Neg Hx      SOCIAL HISTORY:  reports that she has never smoked. She has never used smokeless tobacco. She reports that she does not drink alcohol and does not use drugs.  The patient is widowed and lives in [place]. 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   ALLERGIES: Lipitor [atorvastatin]   MEDICATIONS:  Current Outpatient Medications  Medication Sig Dispense Refill  . aspirin 81 MG chewable tablet Chew by mouth daily.    . calcium citrate-vitamin D (CITRACAL+D) 315-200 MG-UNIT tablet Take 1 tablet by mouth 2 (two) times daily.    . Multiple Vitamins-Minerals (CENTRUM ADULTS PO) Take by mouth.    . simvastatin (ZOCOR) 40 MG tablet Take 40 mg by mouth daily.    . vitamin B-12 (CYANOCOBALAMIN) 500 MCG tablet Take 500 mcg by mouth daily.    . vitamin C (ASCORBIC ACID) 500 MG tablet Take 500   mg by mouth daily.    . vitamin E 400 UNIT capsule Take 400 Units by mouth daily.     No current facility-administered medications for this encounter.     REVIEW OF SYSTEMS: On review of systems, the patient reports that she is doing very well and denies any concerns with how she's tolerating herceptin or how she's doing since surgery. She is curious about the total number of infusions of herceptin she's scheduled for.    PHYSICAL EXAM:  Wt Readings from Last 3 Encounters:  02/04/20 140 lb 9.6 oz (63.8 kg)  01/22/20 137 lb  12.6 oz (62.5 kg)  12/10/19 136 lb 4.8 oz (61.8 kg)   Temp Readings from Last 3 Encounters:  02/04/20 97.6 F (36.4 C) (Tympanic)  01/22/20 (!) 97 F (36.1 C) (Oral)  12/10/19 97.8 F (36.6 C) (Tympanic)   BP Readings from Last 3 Encounters:  02/04/20 (!) 141/74  01/22/20 133/68  12/10/19 (!) 167/76   Pulse Readings from Last 3 Encounters:  02/04/20 82  01/22/20 88  12/10/19 87   In general this is a well appearing caucasian female in no acute distress. She's alert and oriented x4 and appropriate throughout the examination. Cardiopulmonary assessment is negative for acute distress and she exhibits normal effort. Breast exam is deferred.     ECOG = 0  0 - Asymptomatic (Fully active, able to carry on all predisease activities without restriction)  1 - Symptomatic but completely ambulatory (Restricted in physically strenuous activity but ambulatory and able to carry out work of a light or sedentary nature. For example, light housework, office work)  2 - Symptomatic, <50% in bed during the day (Ambulatory and capable of all self care but unable to carry out any work activities. Up and about more than 50% of waking hours)  3 - Symptomatic, >50% in bed, but not bedbound (Capable of only limited self-care, confined to bed or chair 50% or more of waking hours)  4 - Bedbound (Completely disabled. Cannot carry on any self-care. Totally confined to bed or chair)  5 - Death   Eustace Pen MM, Creech RH, Tormey DC, et al. [phone]). "Toxicity and response criteria of the [facility]". [place] [employer]. 5 (6): 649-55    LABORATORY DATA:  Lab Results  Component Value Date   WBC 6.0 02/04/2020   HGB 14.4 02/04/2020   HCT 43.1 02/04/2020   MCV 88.7 02/04/2020   PLT 285 02/04/2020   Lab Results  Component Value Date   NA 140 02/04/2020   K 3.9 02/04/2020   CL 109 02/04/2020   CO2 22 02/04/2020   Lab Results  Component Value Date   ALT 23 02/04/2020   AST 17  02/04/2020   ALKPHOS 80 02/04/2020   BILITOT 0.5 02/04/2020      RADIOGRAPHY: No results found.     IMPRESSION/PLAN: 1. At least Stage IA, pT1bNxM0 grade 3 ER positive, HER2 amplified invasive ductal carcinoma of the left breast. Dr. Lisbeth Renshaw discusses the final pathology findings and reviews the nature of estrogen positive and HER-2 amplified breast disease. She is planning to proceed with anti HER2 therapy but not systemic chemotherapy. Dr. Lisbeth Renshaw reviews the rationale for adjuvant radiotherapy, and the higher risk features of HER2 amplification and would recommend external radiotherapy to the breast followed by antiestrogen therapy. We discussed the risks, benefits, short, and long term effects of radiotherapy, and the patient is interested in proceeding to be aggressive. Dr. Lisbeth Renshaw discusses the delivery  and logistics of radiotherapy and recommends 4 weeks of radiotherapy. Written consent is obtained and placed in the chart, a copy was provided to the patient. She will simulate Thursday morning later this week.   In a visit lasting 45 minutes, greater than 50% of the time was spent face to face discussing the patient's condition, in preparation for the discussion, and coordinating the patient's care.   The above documentation reflects my direct findings during this shared patient visit. Please see the separate note by Dr. Lisbeth Renshaw on this date for the remainder of the patient's plan of care.    Carola Rhine, PAC

## 2020-02-25 NOTE — Progress Notes (Signed)
Patient in for left breast cancer she had a lumpectomy on 01/22/2020. She denies any pain in that area. Denies pacemaker. Never has had radiation before. Questions and concerns addressed.

## 2020-02-27 ENCOUNTER — Other Ambulatory Visit: Payer: Self-pay

## 2020-02-27 ENCOUNTER — Ambulatory Visit
Admission: RE | Admit: 2020-02-27 | Discharge: 2020-02-27 | Disposition: A | Discharge status: Home / Self Care | Payer: Medicare Other | Source: Ambulatory Visit | Attending: Radiation Oncology | Admitting: Radiation Oncology

## 2020-02-27 DIAGNOSIS — C50412 Malignant neoplasm of upper-outer quadrant of left female breast: Secondary | ICD-10-CM | POA: Diagnosis not present

## 2020-02-27 DIAGNOSIS — Z510 Encounter for antineoplastic radiation therapy: Secondary | ICD-10-CM | POA: Insufficient documentation

## 2020-02-27 DIAGNOSIS — Z170 Estrogen receptor positive status [ER+]: Secondary | ICD-10-CM | POA: Insufficient documentation

## 2020-03-03 ENCOUNTER — Encounter: Payer: Self-pay | Admitting: *Deleted

## 2020-03-05 DIAGNOSIS — Z170 Estrogen receptor positive status [ER+]: Secondary | ICD-10-CM | POA: Diagnosis not present

## 2020-03-05 DIAGNOSIS — C50412 Malignant neoplasm of upper-outer quadrant of left female breast: Secondary | ICD-10-CM | POA: Diagnosis not present

## 2020-03-05 DIAGNOSIS — Z510 Encounter for antineoplastic radiation therapy: Secondary | ICD-10-CM | POA: Diagnosis not present

## 2020-03-09 ENCOUNTER — Ambulatory Visit: Payer: Medicare Other | Admitting: Radiation Oncology

## 2020-03-10 ENCOUNTER — Other Ambulatory Visit: Payer: Self-pay

## 2020-03-10 ENCOUNTER — Ambulatory Visit
Admission: RE | Admit: 2020-03-10 | Discharge: 2020-03-10 | Disposition: A | Discharge status: Home / Self Care | Payer: Medicare Other | Source: Ambulatory Visit | Attending: Radiation Oncology | Admitting: Radiation Oncology

## 2020-03-10 DIAGNOSIS — Z510 Encounter for antineoplastic radiation therapy: Secondary | ICD-10-CM | POA: Diagnosis not present

## 2020-03-10 DIAGNOSIS — C50412 Malignant neoplasm of upper-outer quadrant of left female breast: Secondary | ICD-10-CM | POA: Diagnosis not present

## 2020-03-10 DIAGNOSIS — Z170 Estrogen receptor positive status [ER+]: Secondary | ICD-10-CM | POA: Diagnosis not present

## 2020-03-11 ENCOUNTER — Ambulatory Visit
Admission: RE | Admit: 2020-03-11 | Discharge: 2020-03-11 | Disposition: A | Discharge status: Home / Self Care | Payer: Medicare Other | Source: Ambulatory Visit | Attending: Radiation Oncology | Admitting: Radiation Oncology

## 2020-03-11 DIAGNOSIS — Z170 Estrogen receptor positive status [ER+]: Secondary | ICD-10-CM | POA: Diagnosis not present

## 2020-03-11 DIAGNOSIS — Z510 Encounter for antineoplastic radiation therapy: Secondary | ICD-10-CM | POA: Diagnosis not present

## 2020-03-11 DIAGNOSIS — C50412 Malignant neoplasm of upper-outer quadrant of left female breast: Secondary | ICD-10-CM | POA: Diagnosis not present

## 2020-03-12 ENCOUNTER — Ambulatory Visit
Admission: RE | Admit: 2020-03-12 | Discharge: 2020-03-12 | Disposition: A | Discharge status: Home / Self Care | Payer: Medicare Other | Source: Ambulatory Visit | Attending: Radiation Oncology | Admitting: Radiation Oncology

## 2020-03-12 ENCOUNTER — Other Ambulatory Visit: Payer: Self-pay

## 2020-03-12 DIAGNOSIS — Z170 Estrogen receptor positive status [ER+]: Secondary | ICD-10-CM | POA: Diagnosis not present

## 2020-03-12 DIAGNOSIS — C50412 Malignant neoplasm of upper-outer quadrant of left female breast: Secondary | ICD-10-CM | POA: Diagnosis not present

## 2020-03-12 DIAGNOSIS — Z510 Encounter for antineoplastic radiation therapy: Secondary | ICD-10-CM | POA: Diagnosis not present

## 2020-03-12 NOTE — Progress Notes (Signed)
Pt here for patient teaching.  Pt given Radiation and You booklet, skin care instructions, Alra deodorant and Radiaplex gel.  Reviewed areas of pertinence such as fatigue, hair loss, skin changes, breast tenderness and breast swelling . Pt able to give teach back of to pat skin and use unscented/gentle soap,apply Radiaplex bid, avoid applying anything to skin within 4 hours of treatment, avoid wearing an under wire bra and to use an electric razor if they must shave. Pt verbalizes understanding of information given and will contact nursing with any questions or concerns.     Ninamarie Keel M. Greysen Swanton RN, BSN      

## 2020-03-13 ENCOUNTER — Ambulatory Visit
Admission: RE | Admit: 2020-03-13 | Discharge: 2020-03-13 | Disposition: A | Discharge status: Home / Self Care | Payer: Medicare Other | Source: Ambulatory Visit | Attending: Radiation Oncology | Admitting: Radiation Oncology

## 2020-03-13 DIAGNOSIS — Z170 Estrogen receptor positive status [ER+]: Secondary | ICD-10-CM

## 2020-03-13 DIAGNOSIS — C50412 Malignant neoplasm of upper-outer quadrant of left female breast: Secondary | ICD-10-CM

## 2020-03-13 DIAGNOSIS — Z510 Encounter for antineoplastic radiation therapy: Secondary | ICD-10-CM | POA: Diagnosis not present

## 2020-03-13 MED ORDER — ALRA NON-METALLIC DEODORANT (RAD-ONC)
1.0000 "application " | Freq: Once | TOPICAL | Status: AC
  Administered 2020-03-13: 1 via TOPICAL

## 2020-03-13 MED ORDER — RADIAPLEXRX EX GEL: Freq: Once | CUTANEOUS | Status: AC

## 2020-03-16 ENCOUNTER — Ambulatory Visit
Admission: RE | Admit: 2020-03-16 | Discharge: 2020-03-16 | Disposition: A | Discharge status: Home / Self Care | Payer: Medicare Other | Source: Ambulatory Visit | Attending: Radiation Oncology | Admitting: Radiation Oncology

## 2020-03-16 ENCOUNTER — Other Ambulatory Visit: Payer: Self-pay | Admitting: *Deleted

## 2020-03-16 DIAGNOSIS — Z510 Encounter for antineoplastic radiation therapy: Secondary | ICD-10-CM | POA: Diagnosis not present

## 2020-03-16 DIAGNOSIS — C50412 Malignant neoplasm of upper-outer quadrant of left female breast: Secondary | ICD-10-CM | POA: Diagnosis not present

## 2020-03-16 DIAGNOSIS — Z170 Estrogen receptor positive status [ER+]: Secondary | ICD-10-CM | POA: Diagnosis not present

## 2020-03-17 ENCOUNTER — Other Ambulatory Visit: Payer: Self-pay

## 2020-03-17 ENCOUNTER — Inpatient Hospital Stay: Payer: Medicare Other

## 2020-03-17 ENCOUNTER — Ambulatory Visit
Admission: RE | Admit: 2020-03-17 | Discharge: 2020-03-17 | Disposition: A | Discharge status: Home / Self Care | Payer: Medicare Other | Source: Ambulatory Visit | Attending: Radiation Oncology | Admitting: Radiation Oncology

## 2020-03-17 VITALS — BP 120/69 | HR 79 | Temp 97.5°F | Resp 18 | Wt 141.4 lb

## 2020-03-17 DIAGNOSIS — M810 Age-related osteoporosis without current pathological fracture: Secondary | ICD-10-CM | POA: Diagnosis not present

## 2020-03-17 DIAGNOSIS — C50412 Malignant neoplasm of upper-outer quadrant of left female breast: Secondary | ICD-10-CM

## 2020-03-17 DIAGNOSIS — Z170 Estrogen receptor positive status [ER+]: Secondary | ICD-10-CM

## 2020-03-17 DIAGNOSIS — Z79899 Other long term (current) drug therapy: Secondary | ICD-10-CM | POA: Diagnosis not present

## 2020-03-17 DIAGNOSIS — E785 Hyperlipidemia, unspecified: Secondary | ICD-10-CM | POA: Diagnosis not present

## 2020-03-17 DIAGNOSIS — Z5112 Encounter for antineoplastic immunotherapy: Secondary | ICD-10-CM | POA: Diagnosis not present

## 2020-03-17 DIAGNOSIS — Z510 Encounter for antineoplastic radiation therapy: Secondary | ICD-10-CM | POA: Diagnosis not present

## 2020-03-17 LAB — COMPREHENSIVE METABOLIC PANEL
ALT: 24 U/L (ref 0–44)
AST: 19 U/L (ref 15–41)
Albumin: 3.8 g/dL (ref 3.5–5.0)
Alkaline Phosphatase: 83 U/L (ref 38–126)
Anion gap: 8 (ref 5–15)
BUN: 13 mg/dL (ref 8–23)
CO2: 24 mmol/L (ref 22–32)
Calcium: 9.3 mg/dL (ref 8.9–10.3)
Chloride: 107 mmol/L (ref 98–111)
Creatinine, Ser: 0.99 mg/dL (ref 0.44–1.00)
GFR, Estimated: 57 mL/min — ABNORMAL LOW (ref >60)
Glucose, Bld: 109 mg/dL — ABNORMAL HIGH (ref 70–99)
Potassium: 4 mmol/L (ref 3.5–5.1)
Sodium: 139 mmol/L (ref 135–145)
Total Bilirubin: 0.5 mg/dL (ref 0.3–1.2)
Total Protein: 7.7 g/dL (ref 6.5–8.1)

## 2020-03-17 LAB — CBC WITH DIFFERENTIAL/PLATELET
Abs Immature Granulocytes: 0.01 10*3/uL (ref 0.00–0.07)
Basophils Absolute: 0 10*3/uL (ref 0.0–0.1)
Basophils Relative: 1 %
Eosinophils Absolute: 0.3 10*3/uL (ref 0.0–0.5)
Eosinophils Relative: 5 %
HCT: 40.5 % (ref 36.0–46.0)
Hemoglobin: 13.6 g/dL (ref 12.0–15.0)
Immature Granulocytes: 0 %
Lymphocytes Relative: 19 %
Lymphs Abs: 1 10*3/uL (ref 0.7–4.0)
MCH: 29.8 pg (ref 26.0–34.0)
MCHC: 33.6 g/dL (ref 30.0–36.0)
MCV: 88.6 fL (ref 80.0–100.0)
Monocytes Absolute: 0.6 10*3/uL (ref 0.1–1.0)
Monocytes Relative: 11 %
Neutro Abs: 3.4 10*3/uL (ref 1.7–7.7)
Neutrophils Relative %: 64 %
Platelets: 269 10*3/uL (ref 150–400)
RBC: 4.57 MIL/uL (ref 3.87–5.11)
RDW: 13 % (ref 11.5–15.5)
WBC: 5.3 10*3/uL (ref 4.0–10.5)
nRBC: 0 % (ref 0.0–0.2)

## 2020-03-17 MED ORDER — DIPHENHYDRAMINE HCL 25 MG PO CAPS
ORAL_CAPSULE | ORAL | Status: AC
  Filled 2020-03-17: qty 1

## 2020-03-17 MED ORDER — DIPHENHYDRAMINE HCL 25 MG PO CAPS
25.0000 mg | ORAL_CAPSULE | Freq: Once | ORAL | Status: AC
  Administered 2020-03-17: 25 mg via ORAL

## 2020-03-17 MED ORDER — SODIUM CHLORIDE 0.9 % IV SOLN
Freq: Once | INTRAVENOUS | Status: AC
  Filled 2020-03-17: qty 250

## 2020-03-17 MED ORDER — SODIUM CHLORIDE 0.9% FLUSH
10.0000 mL | INTRAVENOUS | Status: DC | PRN
  Filled 2020-03-17: qty 10

## 2020-03-17 MED ORDER — TRASTUZUMAB-DKST CHEMO 150 MG IV SOLR
6.0000 mg/kg | Freq: Once | INTRAVENOUS | Status: AC
  Administered 2020-03-17: 378 mg via INTRAVENOUS
  Filled 2020-03-17: qty 18

## 2020-03-17 MED ORDER — ACETAMINOPHEN 325 MG PO TABS
ORAL_TABLET | ORAL | Status: AC
  Filled 2020-03-17: qty 2

## 2020-03-17 MED ORDER — ACETAMINOPHEN 325 MG PO TABS
650.0000 mg | ORAL_TABLET | Freq: Once | ORAL | Status: AC
  Administered 2020-03-17: 650 mg via ORAL

## 2020-03-17 NOTE — Patient Instructions (Signed)
[facility] Discharge Instructions for Patients Receiving Chemotherapy  Today you received the following chemotherapy agents Traztuzumab  To help prevent nausea and vomiting after your treatment, we encourage you to take your nausea medication as directed.    If you develop nausea and vomiting that is not controlled by your nausea medication, call the clinic.   BELOW ARE SYMPTOMS THAT SHOULD BE REPORTED IMMEDIATELY:  *FEVER GREATER THAN 100.5 F  *CHILLS WITH OR WITHOUT FEVER  NAUSEA AND VOMITING THAT IS NOT CONTROLLED WITH YOUR NAUSEA MEDICATION  *UNUSUAL SHORTNESS OF BREATH  *UNUSUAL BRUISING OR BLEEDING  TENDERNESS IN MOUTH AND THROAT WITH OR WITHOUT PRESENCE OF ULCERS  *URINARY PROBLEMS  *BOWEL PROBLEMS  UNUSUAL RASH Items with * indicate a potential emergency and should be followed up as soon as possible.  Feel free to call the clinic should you have any questions or concerns. The clinic phone number is [phone].  Please show the CHEMO ALERT CARD at check-in to the Emergency Department and triage nurse.   

## 2020-03-18 ENCOUNTER — Ambulatory Visit
Admission: RE | Admit: 2020-03-18 | Discharge: 2020-03-18 | Disposition: A | Discharge status: Home / Self Care | Payer: Medicare Other | Source: Ambulatory Visit | Attending: Radiation Oncology | Admitting: Radiation Oncology

## 2020-03-18 ENCOUNTER — Other Ambulatory Visit: Payer: Self-pay

## 2020-03-18 DIAGNOSIS — Z170 Estrogen receptor positive status [ER+]: Secondary | ICD-10-CM | POA: Diagnosis not present

## 2020-03-18 DIAGNOSIS — C50412 Malignant neoplasm of upper-outer quadrant of left female breast: Secondary | ICD-10-CM | POA: Diagnosis not present

## 2020-03-18 DIAGNOSIS — Z510 Encounter for antineoplastic radiation therapy: Secondary | ICD-10-CM | POA: Diagnosis not present

## 2020-03-19 ENCOUNTER — Other Ambulatory Visit: Payer: Self-pay

## 2020-03-19 ENCOUNTER — Ambulatory Visit
Admission: RE | Admit: 2020-03-19 | Discharge: 2020-03-19 | Disposition: A | Discharge status: Home / Self Care | Payer: Medicare Other | Source: Ambulatory Visit | Attending: Radiation Oncology | Admitting: Radiation Oncology

## 2020-03-19 DIAGNOSIS — C50412 Malignant neoplasm of upper-outer quadrant of left female breast: Secondary | ICD-10-CM | POA: Diagnosis not present

## 2020-03-19 DIAGNOSIS — Z510 Encounter for antineoplastic radiation therapy: Secondary | ICD-10-CM | POA: Diagnosis not present

## 2020-03-19 DIAGNOSIS — Z170 Estrogen receptor positive status [ER+]: Secondary | ICD-10-CM | POA: Diagnosis not present

## 2020-03-20 ENCOUNTER — Ambulatory Visit
Admission: RE | Admit: 2020-03-20 | Discharge: 2020-03-20 | Disposition: A | Discharge status: Home / Self Care | Payer: Medicare Other | Source: Ambulatory Visit | Attending: Radiation Oncology | Admitting: Radiation Oncology

## 2020-03-20 ENCOUNTER — Other Ambulatory Visit: Payer: Self-pay

## 2020-03-20 DIAGNOSIS — Z170 Estrogen receptor positive status [ER+]: Secondary | ICD-10-CM | POA: Diagnosis not present

## 2020-03-20 DIAGNOSIS — Z510 Encounter for antineoplastic radiation therapy: Secondary | ICD-10-CM | POA: Diagnosis not present

## 2020-03-20 DIAGNOSIS — C50412 Malignant neoplasm of upper-outer quadrant of left female breast: Secondary | ICD-10-CM | POA: Diagnosis not present

## 2020-03-23 ENCOUNTER — Ambulatory Visit
Admission: RE | Admit: 2020-03-23 | Discharge: 2020-03-23 | Disposition: A | Discharge status: Home / Self Care | Payer: Medicare Other | Source: Ambulatory Visit | Attending: Radiation Oncology | Admitting: Radiation Oncology

## 2020-03-23 DIAGNOSIS — Z510 Encounter for antineoplastic radiation therapy: Secondary | ICD-10-CM | POA: Diagnosis not present

## 2020-03-23 DIAGNOSIS — Z170 Estrogen receptor positive status [ER+]: Secondary | ICD-10-CM | POA: Diagnosis not present

## 2020-03-23 DIAGNOSIS — C50412 Malignant neoplasm of upper-outer quadrant of left female breast: Secondary | ICD-10-CM | POA: Diagnosis not present

## 2020-03-24 ENCOUNTER — Encounter: Payer: Self-pay | Admitting: Radiation Oncology

## 2020-03-24 ENCOUNTER — Ambulatory Visit
Admission: RE | Admit: 2020-03-24 | Discharge: 2020-03-24 | Disposition: A | Discharge status: Home / Self Care | Payer: Medicare Other | Source: Ambulatory Visit | Attending: Radiation Oncology | Admitting: Radiation Oncology

## 2020-03-24 ENCOUNTER — Other Ambulatory Visit: Payer: Self-pay

## 2020-03-24 DIAGNOSIS — Z510 Encounter for antineoplastic radiation therapy: Secondary | ICD-10-CM | POA: Insufficient documentation

## 2020-03-24 DIAGNOSIS — Z170 Estrogen receptor positive status [ER+]: Secondary | ICD-10-CM | POA: Diagnosis not present

## 2020-03-24 DIAGNOSIS — C50412 Malignant neoplasm of upper-outer quadrant of left female breast: Secondary | ICD-10-CM | POA: Diagnosis not present

## 2020-03-25 ENCOUNTER — Other Ambulatory Visit: Payer: Self-pay

## 2020-03-25 ENCOUNTER — Ambulatory Visit
Admission: RE | Admit: 2020-03-25 | Discharge: 2020-03-25 | Disposition: A | Discharge status: Home / Self Care | Payer: Medicare Other | Source: Ambulatory Visit | Attending: Radiation Oncology | Admitting: Radiation Oncology

## 2020-03-25 DIAGNOSIS — Z510 Encounter for antineoplastic radiation therapy: Secondary | ICD-10-CM | POA: Diagnosis not present

## 2020-03-25 DIAGNOSIS — Z170 Estrogen receptor positive status [ER+]: Secondary | ICD-10-CM | POA: Diagnosis not present

## 2020-03-25 DIAGNOSIS — C50412 Malignant neoplasm of upper-outer quadrant of left female breast: Secondary | ICD-10-CM | POA: Diagnosis not present

## 2020-03-26 ENCOUNTER — Ambulatory Visit
Admission: RE | Admit: 2020-03-26 | Discharge: 2020-03-26 | Disposition: A | Discharge status: Home / Self Care | Payer: Medicare Other | Source: Ambulatory Visit | Attending: Radiation Oncology | Admitting: Radiation Oncology

## 2020-03-26 DIAGNOSIS — Z170 Estrogen receptor positive status [ER+]: Secondary | ICD-10-CM | POA: Diagnosis not present

## 2020-03-26 DIAGNOSIS — Z510 Encounter for antineoplastic radiation therapy: Secondary | ICD-10-CM | POA: Diagnosis not present

## 2020-03-26 DIAGNOSIS — C50412 Malignant neoplasm of upper-outer quadrant of left female breast: Secondary | ICD-10-CM | POA: Diagnosis not present

## 2020-03-26 NOTE — Progress Notes (Signed)
  Radiation Oncology         [phone] ________________________________  Name: Holly Leon MRN: [id number]  Date: 03/24/2020  DOB: 17-Feb-1936  SIMULATION NOTE   NARRATIVE:  The patient underwent simulation today for ongoing radiation therapy.  The existing CT study set was employed for the purpose of virtual treatment planning.  The target and avoidance structures were reviewed and modified as necessary.  Treatment planning then occurred.  The radiation boost prescription was entered and confirmed.  A total of 1 complex treatment devices were fabricated in the form of multi-leaf collimators to shape radiation around the targets while maximally excluding nearby normal structures. I have requested : Isodose Plan.    PLAN:  This modified radiation beam arrangement is intended to continue the current radiation dose to an additional 8 Gy in 4 fractions for a total cumulative dose of 50.56 Gy.    ------------------------------------------------  Jodelle Gross, MD, PhD

## 2020-03-27 ENCOUNTER — Ambulatory Visit
Admission: RE | Admit: 2020-03-27 | Discharge: 2020-03-27 | Disposition: A | Discharge status: Home / Self Care | Payer: Medicare Other | Source: Ambulatory Visit | Attending: Radiation Oncology | Admitting: Radiation Oncology

## 2020-03-27 ENCOUNTER — Other Ambulatory Visit: Payer: Self-pay

## 2020-03-27 ENCOUNTER — Ambulatory Visit: Payer: Medicare Other | Admitting: Radiation Oncology

## 2020-03-27 DIAGNOSIS — Z510 Encounter for antineoplastic radiation therapy: Secondary | ICD-10-CM | POA: Diagnosis not present

## 2020-03-27 DIAGNOSIS — Z170 Estrogen receptor positive status [ER+]: Secondary | ICD-10-CM | POA: Diagnosis not present

## 2020-03-27 DIAGNOSIS — C50412 Malignant neoplasm of upper-outer quadrant of left female breast: Secondary | ICD-10-CM | POA: Diagnosis not present

## 2020-03-30 ENCOUNTER — Ambulatory Visit: Payer: Medicare Other | Admitting: Radiation Oncology

## 2020-03-30 ENCOUNTER — Ambulatory Visit
Admission: RE | Admit: 2020-03-30 | Discharge: 2020-03-30 | Disposition: A | Discharge status: Home / Self Care | Payer: Medicare Other | Source: Ambulatory Visit | Attending: Radiation Oncology | Admitting: Radiation Oncology

## 2020-03-30 DIAGNOSIS — C50412 Malignant neoplasm of upper-outer quadrant of left female breast: Secondary | ICD-10-CM | POA: Diagnosis not present

## 2020-03-30 DIAGNOSIS — Z170 Estrogen receptor positive status [ER+]: Secondary | ICD-10-CM | POA: Diagnosis not present

## 2020-03-30 DIAGNOSIS — Z510 Encounter for antineoplastic radiation therapy: Secondary | ICD-10-CM | POA: Diagnosis not present

## 2020-03-31 ENCOUNTER — Other Ambulatory Visit: Payer: Self-pay

## 2020-03-31 ENCOUNTER — Ambulatory Visit
Admission: RE | Admit: 2020-03-31 | Discharge: 2020-03-31 | Disposition: A | Discharge status: Home / Self Care | Payer: Medicare Other | Source: Ambulatory Visit | Attending: Radiation Oncology | Admitting: Radiation Oncology

## 2020-03-31 ENCOUNTER — Ambulatory Visit: Payer: Medicare Other

## 2020-03-31 DIAGNOSIS — Z170 Estrogen receptor positive status [ER+]: Secondary | ICD-10-CM | POA: Diagnosis not present

## 2020-03-31 DIAGNOSIS — C50412 Malignant neoplasm of upper-outer quadrant of left female breast: Secondary | ICD-10-CM | POA: Diagnosis not present

## 2020-03-31 DIAGNOSIS — Z510 Encounter for antineoplastic radiation therapy: Secondary | ICD-10-CM | POA: Diagnosis not present

## 2020-04-01 ENCOUNTER — Ambulatory Visit: Payer: Medicare Other

## 2020-04-01 ENCOUNTER — Ambulatory Visit
Admission: RE | Admit: 2020-04-01 | Discharge: 2020-04-01 | Disposition: A | Discharge status: Home / Self Care | Payer: Medicare Other | Source: Ambulatory Visit | Attending: Radiation Oncology | Admitting: Radiation Oncology

## 2020-04-01 DIAGNOSIS — C50412 Malignant neoplasm of upper-outer quadrant of left female breast: Secondary | ICD-10-CM | POA: Diagnosis not present

## 2020-04-01 DIAGNOSIS — Z170 Estrogen receptor positive status [ER+]: Secondary | ICD-10-CM | POA: Diagnosis not present

## 2020-04-01 DIAGNOSIS — Z510 Encounter for antineoplastic radiation therapy: Secondary | ICD-10-CM | POA: Diagnosis not present

## 2020-04-02 ENCOUNTER — Ambulatory Visit
Admission: RE | Admit: 2020-04-02 | Discharge: 2020-04-02 | Disposition: A | Discharge status: Home / Self Care | Payer: Medicare Other | Source: Ambulatory Visit | Attending: Radiation Oncology | Admitting: Radiation Oncology

## 2020-04-02 ENCOUNTER — Encounter: Payer: Self-pay | Admitting: *Deleted

## 2020-04-02 DIAGNOSIS — Z170 Estrogen receptor positive status [ER+]: Secondary | ICD-10-CM | POA: Diagnosis not present

## 2020-04-02 DIAGNOSIS — Z510 Encounter for antineoplastic radiation therapy: Secondary | ICD-10-CM | POA: Diagnosis not present

## 2020-04-02 DIAGNOSIS — C50412 Malignant neoplasm of upper-outer quadrant of left female breast: Secondary | ICD-10-CM | POA: Diagnosis not present

## 2020-04-03 ENCOUNTER — Ambulatory Visit
Admission: RE | Admit: 2020-04-03 | Discharge: 2020-04-03 | Disposition: A | Discharge status: Home / Self Care | Payer: Medicare Other | Source: Ambulatory Visit | Attending: Radiation Oncology | Admitting: Radiation Oncology

## 2020-04-03 ENCOUNTER — Ambulatory Visit: Payer: Medicare Other

## 2020-04-03 ENCOUNTER — Other Ambulatory Visit: Payer: Self-pay

## 2020-04-03 DIAGNOSIS — Z510 Encounter for antineoplastic radiation therapy: Secondary | ICD-10-CM | POA: Diagnosis not present

## 2020-04-03 DIAGNOSIS — Z170 Estrogen receptor positive status [ER+]: Secondary | ICD-10-CM | POA: Diagnosis not present

## 2020-04-03 DIAGNOSIS — C50412 Malignant neoplasm of upper-outer quadrant of left female breast: Secondary | ICD-10-CM | POA: Diagnosis not present

## 2020-04-06 ENCOUNTER — Encounter: Payer: Self-pay | Admitting: Radiation Oncology

## 2020-04-06 ENCOUNTER — Other Ambulatory Visit: Payer: Self-pay

## 2020-04-06 ENCOUNTER — Ambulatory Visit
Admission: RE | Admit: 2020-04-06 | Discharge: 2020-04-06 | Disposition: A | Discharge status: Home / Self Care | Payer: Medicare Other | Source: Ambulatory Visit | Attending: Radiation Oncology | Admitting: Radiation Oncology

## 2020-04-06 DIAGNOSIS — Z170 Estrogen receptor positive status [ER+]: Secondary | ICD-10-CM | POA: Diagnosis not present

## 2020-04-06 DIAGNOSIS — Z510 Encounter for antineoplastic radiation therapy: Secondary | ICD-10-CM | POA: Diagnosis not present

## 2020-04-06 DIAGNOSIS — C50412 Malignant neoplasm of upper-outer quadrant of left female breast: Secondary | ICD-10-CM | POA: Diagnosis not present

## 2020-04-06 NOTE — Progress Notes (Signed)
[place]  [phone] Fax:([phone]) [phone]     ID: Holly Leon DOB: 1935/04/21  MR#: [id number]  QDI#:[id number]  Patient Care Team: Shon Baton, MD as PCP - General (Internal Medicine) Jovita Kussmaul, MD as Consulting Physician (General Surgery) Kyung Rudd, MD as Consulting Physician (Radiation Oncology) Samaiya Awadallah, Virgie Dad, MD as Consulting Physician (Oncology) Mauro Kaufmann, RN as Oncology Nurse Navigator Rockwell Germany, RN as Oncology Nurse Navigator Larey Dresser, MD as Consulting Physician (Cardiology) Chauncey Cruel, MD OTHER MD:  CHIEF COMPLAINT: HER-2 positive estrogen receptor positive breast cancer  CURRENT TREATMENT: Trastuzumab   INTERVAL HISTORY: Holly Leon returns today for follow up of her Her2 positive breast cancer.   Since her last visit, she was referred back to Dr. Lisbeth Renshaw on 02/25/2020 to review radiation therapy. She subsequently received radiation treatment from 03/10/2020 through yesterday, 04/06/2020.  She tolerated radiation treatments well, with no unusual fatigue and no significant skin changes.  She is now ready to consider antiestrogens   REVIEW OF SYSTEMS: Holly Leon tells me she finally retired 02/21/2020.  One of her son came to visit for 5 days which she enjoyed and she is planning trips to the beach and to [place] where she is from.  She exercises chiefly by walking.  A detailed review of systems today was otherwise noncontributory   COVID 19 VACCINATION STATUS: refuses vaccination   HISTORY OF CURRENT ILLNESS: From the original intake note:  Holly Leon had routine screening mammography on 09/30/2019 showing a possible abnormality in the left breast. She underwent left diagnostic mammography with tomography and left breast ultrasonography at [facility] on 10/18/2019 showing: breast density category B; indeterminate 9 mm left breast mass at 1:30; no axillary adenopathy.  Accordingly on  10/31/2019 she proceeded to biopsy of the left breast area in question. The pathology from this procedure ([id number]) showed: fibrocystic changes. On post-biopsy mammogram, the biopsy clip was found to have migrated approximately 3.6 cm medially. She returned for repeat biopsy on 11/11/2019, with pathology ([id number]) showing: invasive mammary carcinoma, grade 3, e-cadherin positive. Prognostic indicators significant for: estrogen receptor, 80% positive with moderate staining intensity and progesterone receptor, 0% negative. Proliferation marker Ki67 at 70%. HER2 equivocal by immunohistochemistry (2+), but positive by fluorescent in situ hybridization with a signals ratio 2.83 and number per cell 5.65.  [name]   PAST MEDICAL HISTORY: Past Medical History:  Diagnosis Date   History of left breast cancer    Hyperlipidemia    Osteoporosis    Postural dizziness with presyncope     PAST SURGICAL HISTORY: Past Surgical History:  Procedure Laterality Date   BREAST LUMPECTOMY WITH RADIOACTIVE SEED LOCALIZATION Left 01/22/2020   Procedure: LEFT BREAST LUMPECTOMY WITH RADIOACTIVE SEED LOCALIZATION;  Surgeon: Jovita Kussmaul, MD;  Location: [facility];  Service: General;  Laterality: Left;   TONSILLECTOMY     TUBAL LIGATION      FAMILY HISTORY: Family History  Problem Relation Age of Onset   Colon cancer Neg Hx    Esophageal cancer Neg Hx    Rectal cancer Neg Hx    Stomach cancer Neg Hx   The patient's father died at the age of 29 from heart disease. The patient's mother died at the age of 58 from heart disease. The patient has 1 brother who died at age 32 from heart disease. There have been some cancers on both sides of the family but the patient  really does not have information regarding what type of cancers they were or when the diagnoses were made. She will try to get that information for [place]   GYNECOLOGIC HISTORY:  No  LMP recorded. Patient is postmenopausal. Menarche: 86 years old Age at first live birth: 85 years old [place] P 2 LMP early 28s Contraceptive early for several years without complications HRT no Hysterectomy? no BSO? no   SOCIAL HISTORY: (updated 11/2019)  Holly Leon is widowed and lives by herself with no pets. Her husband was in the [employer]. 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 Her son Holly Leon lives in [place] and her son Holly Leon lives in [place]. They both work in [employer]. The patient has no grandchildren. She attends [employer]    ADVANCED DIRECTIVES: Not in place. At the 12/10/2019 visit the patient was given the appropriate documents to complete and notarized at her discretion. She is planning to name her son Holly Leon as her healthcare power of attorney   HEALTH MAINTENANCE: Social History   Tobacco Use   Smoking status: Never Smoker   Smokeless tobacco: Never Used  Vaping Use   Vaping Use: Never used  Substance Use Topics   Alcohol use: No   Drug use: No     Colonoscopy: 12/2016  PAP: none on file  Bone density: none on file   Allergies  Allergen Reactions   Lipitor [Atorvastatin] Hives    Current Outpatient Medications  Medication Sig Dispense Refill   aspirin 81 MG chewable tablet Chew by mouth daily.     calcium citrate-vitamin D (CITRACAL+D) 315-200 MG-UNIT tablet Take 1 tablet by mouth 2 (two) times daily.     Multiple Vitamins-Minerals (CENTRUM ADULTS PO) Take by mouth.     simvastatin (ZOCOR) 40 MG tablet Take 40 mg by mouth daily.     vitamin B-12 (CYANOCOBALAMIN) 500 MCG tablet Take 500 mcg by mouth daily.     vitamin C (ASCORBIC ACID) 500 MG tablet Take 500 mg by mouth daily.     vitamin E 400 UNIT capsule Take 400 Units by mouth daily.     No current facility-administered medications for this visit.    OBJECTIVE: White woman in no acute distress  Vitals:    04/07/20 0808  BP: (!) 157/74  Pulse: 80  Resp: 18  Temp: 97.7 F (36.5 C)  SpO2: 100%     Body mass index is 26.74 kg/m.   Wt Readings from Last 3 Encounters:  04/07/20 141 lb 8 oz (64.2 kg)  03/17/20 141 lb 6.4 oz (64.1 kg)  02/25/20 139 lb 9.6 oz (63.3 kg)      ECOG FS:1 - Symptomatic but completely ambulatory  Sclerae unicteric, EOMs intact Wearing a mask No cervical or supraclavicular adenopathy Lungs no rales or rhonchi Heart regular rate and rhythm Abd soft, nontender, positive bowel sounds MSK no focal spinal tenderness, no upper extremity lymphedema Neuro: nonfocal, well oriented, appropriate affect Breasts: The right breast is unremarkable.  The left breast is status post lumpectomy and radiation.  The cosmetic result is excellent.  There is minimal erythema.  There is no evidence of residual or recurrent disease.  Both axillae are benign.   LAB RESULTS:  CMP     Component Value Date/Time   NA 136 04/07/2020 0739   K 4.0 04/07/2020 0739   CL 105 04/07/2020 0739   CO2 23 04/07/2020 0739   GLUCOSE 112 (H)  04/07/2020 0739   BUN 15 04/07/2020 0739   CREATININE 1.01 (H) 04/07/2020 0739   CREATININE 0.87 12/10/2019 1448   CALCIUM 9.8 04/07/2020 0739   PROT 8.0 04/07/2020 0739   ALBUMIN 3.9 04/07/2020 0739   AST 23 04/07/2020 0739   AST 16 12/10/2019 1448   ALT 27 04/07/2020 0739   ALT 15 12/10/2019 1448   ALKPHOS 89 04/07/2020 0739   BILITOT 0.7 04/07/2020 0739   BILITOT 0.5 12/10/2019 1448   GFRNONAA 56 (L) 04/07/2020 0739   GFRNONAA >60 12/10/2019 1448   GFRAA  03/24/2008 1642    >60        The eGFR has been calculated using the MDRD equation. This calculation has not been validated in all clinical situations. eGFR's persistently <60 mL/min signify possible Chronic Kidney Disease.    No results found for: TOTALPROTELP, ALBUMINELP, A1GS, A2GS, BETS, BETA2SER, GAMS, MSPIKE, SPEI  Lab Results  Component Value Date   WBC 3.8 (L) 04/07/2020    NEUTROABS 2.5 04/07/2020   HGB 13.8 04/07/2020   HCT 41.2 04/07/2020   MCV 90.0 04/07/2020   PLT 253 04/07/2020    No results found for: LABCA2  No components found for: [id number]  No results for input(s): INR in the last 168 hours.  No results found for: LABCA2  No results found for: HAF790  No results found for: XYB338  No results found for: VAN191  No results found for: CA2729  No components found for: HGQUANT  No results found for: CEA1 / No results found for: CEA1   No results found for: AFPTUMOR  No results found for: CHROMOGRNA  No results found for: KPAFRELGTCHN, LAMBDASER, KAPLAMBRATIO (kappa/lambda light chains)  No results found for: HGBA, HGBA2QUANT, HGBFQUANT, HGBSQUAN (Hemoglobinopathy evaluation)   No results found for: LDH  No results found for: IRON, TIBC, IRONPCTSAT (Iron and TIBC)  No results found for: FERRITIN  Urinalysis No results found for: COLORURINE, APPEARANCEUR, LABSPEC, PHURINE, GLUCOSEU, HGBUR, BILIRUBINUR, KETONESUR, PROTEINUR, UROBILINOGEN, NITRITE, LEUKOCYTESUR   STUDIES: No results found.   ELIGIBLE FOR AVAILABLE RESEARCH PROTOCOL: no   ASSESSMENT: 85 y.o. [place] woman status post left breast upper outer quadrant biopsy 11/11/2019 for a clinical T1b N0, stage IA invasive ductal carcinoma, grade 3, estrogen receptor positive, progesterone receptor negative, HER-2 amplified, with an MIB-1 of 70%  (1) s/p left lumpectomy w/o sentinel node sampling 01/22/2020 showed a pT1b cN0, stage IB invasive ductal carcinoma, grade 3, with negative margins  (2) anti-HER-2 immunotherapy starting 02/04/2020, to continue every 21 days for 1 year  (a) echo 01/02/2020 shows an EF of 70-75%  (3) adjuvant radiation completed 04/06/2020  (4) antiestrogens discussed 04/07/2020, patient opted against   PLAN:  Donnita tolerated her radiation treatments quite well.  She is also tolerating trastuzumab without any side effects that she is  aware of.  We do need to repeat an echo and I have entered that order.  Today we discussed the difference between tamoxifen and anastrozole in detail. She understands that anastrozole and the aromatase inhibitors in general work by blocking estrogen production. Accordingly vaginal dryness, decrease in bone density, and of course hot flashes can result. The aromatase inhibitors can also negatively affect the cholesterol profile, although that is a minor effect. One out of 5 women on aromatase inhibitors we will feel "old and achy". This arthralgia/myalgia syndrome, which resembles fibromyalgia clinically, does resolve with stopping the medications. Accordingly this is not a reason to not try an aromatase inhibitor but it is  a frequent reason to stop it (in other words 20% of women will not be able to tolerate these medications).  Tamoxifen on the other hand does not block estrogen production. It does not "take away a woman's estrogen". It blocks the estrogen receptor in breast cells. Like anastrozole, it can also cause hot flashes. As opposed to anastrozole, tamoxifen has many estrogen-like effects. It is technically an estrogen receptor modulator. This means that in some tissues tamoxifen works like estrogen-- for example it helps strengthen the bones. It tends to improve the cholesterol profile. It can cause thickening of the endometrial lining, and even endometrial polyps or rarely cancer of the uterus.(The risk of uterine cancer due to tamoxifen is one additional cancer per thousand women year). It can cause vaginal wetness or stickiness. It can cause Leon clots through this estrogen-like effect--the risk of Leon clots with tamoxifen is exactly the same as with birth control pills or hormone replacement.  Neither of these agents causes mood changes or weight gain, despite the popular belief that they can have these side effects. We have data from studies comparing either of these drugs with placebo, and  in those cases the control group had the same amount of weight gain and depression as the group that took the drug.  After much discussion and she tells me considering her age and her overall good prognosis she would like to pass on the antiestrogens.  She is of course appropriately concerned regarding osteoporosis with anastrozole and clotting and endometrial cancer issues with tamoxifen  She also was interested in curtailing the trastuzumab treatments to 6 months.  We actually do have data for that and in her case I am comfortable with that.  I would not be comfortable doing only 3 months.  In short the plan is to complete a total of 6 months of trastuzumab.  Those orders have been entered.  She will have an echocardiogram later this month and then again after the completion of her treatments.  She is planning a couple of trips to [place] and if there is a date conflict we can certainly adjust her treatment dates as far as the Herceptin is concerned.  Total encounter time 30 minutes.Sarajane Jews C. Kristl Morioka, MD 04/07/2020 8:34 AM Medical Oncology and Hematology [facility] [place], [place] [address] Tel. [phone]    Fax. [phone]   This document serves as a record of services personally performed by Lurline Del, MD. It was created on his behalf by Wilburn Mylar, a trained medical scribe. The creation of this record is based on the scribe's personal observations and the provider's statements to them.   I, Lurline Del MD, have reviewed the above documentation for accuracy and completeness, and I agree with the above.   *Total Encounter Time as defined by the Centers for Medicare and Medicaid Services includes, in addition to the face-to-face time of a patient visit (documented in the note above) non-face-to-face time: obtaining and reviewing outside history, ordering and reviewing medications, tests or procedures, care  coordination (communications with other health care professionals or caregivers) and documentation in the medical record.

## 2020-04-07 ENCOUNTER — Inpatient Hospital Stay: Payer: Medicare Other | Attending: Oncology

## 2020-04-07 ENCOUNTER — Inpatient Hospital Stay: Payer: Medicare Other

## 2020-04-07 ENCOUNTER — Encounter: Payer: Self-pay | Admitting: Gastroenterology

## 2020-04-07 ENCOUNTER — Inpatient Hospital Stay (HOSPITAL_BASED_OUTPATIENT_CLINIC_OR_DEPARTMENT_OTHER): Payer: Medicare Other | Admitting: Oncology

## 2020-04-07 ENCOUNTER — Other Ambulatory Visit: Payer: Self-pay

## 2020-04-07 VITALS — BP 157/74 | HR 80 | Temp 97.7°F | Resp 18 | Ht 61.0 in | Wt 141.5 lb

## 2020-04-07 DIAGNOSIS — Z7982 Long term (current) use of aspirin: Secondary | ICD-10-CM | POA: Diagnosis not present

## 2020-04-07 DIAGNOSIS — Z170 Estrogen receptor positive status [ER+]: Secondary | ICD-10-CM | POA: Insufficient documentation

## 2020-04-07 DIAGNOSIS — C50412 Malignant neoplasm of upper-outer quadrant of left female breast: Secondary | ICD-10-CM

## 2020-04-07 DIAGNOSIS — Z79899 Other long term (current) drug therapy: Secondary | ICD-10-CM | POA: Insufficient documentation

## 2020-04-07 DIAGNOSIS — M810 Age-related osteoporosis without current pathological fracture: Secondary | ICD-10-CM | POA: Insufficient documentation

## 2020-04-07 DIAGNOSIS — Z923 Personal history of irradiation: Secondary | ICD-10-CM | POA: Insufficient documentation

## 2020-04-07 DIAGNOSIS — E785 Hyperlipidemia, unspecified: Secondary | ICD-10-CM | POA: Insufficient documentation

## 2020-04-07 DIAGNOSIS — Z5112 Encounter for antineoplastic immunotherapy: Secondary | ICD-10-CM | POA: Diagnosis not present

## 2020-04-07 LAB — COMPREHENSIVE METABOLIC PANEL
ALT: 27 U/L (ref 0–44)
AST: 23 U/L (ref 15–41)
Albumin: 3.9 g/dL (ref 3.5–5.0)
Alkaline Phosphatase: 89 U/L (ref 38–126)
Anion gap: 8 (ref 5–15)
BUN: 15 mg/dL (ref 8–23)
CO2: 23 mmol/L (ref 22–32)
Calcium: 9.8 mg/dL (ref 8.9–10.3)
Chloride: 105 mmol/L (ref 98–111)
Creatinine, Ser: 1.01 mg/dL — ABNORMAL HIGH (ref 0.44–1.00)
GFR, Estimated: 56 mL/min — ABNORMAL LOW (ref >60)
Glucose, Bld: 112 mg/dL — ABNORMAL HIGH (ref 70–99)
Potassium: 4 mmol/L (ref 3.5–5.1)
Sodium: 136 mmol/L (ref 135–145)
Total Bilirubin: 0.7 mg/dL (ref 0.3–1.2)
Total Protein: 8 g/dL (ref 6.5–8.1)

## 2020-04-07 LAB — CBC WITH DIFFERENTIAL/PLATELET
Abs Immature Granulocytes: 0.01 10*3/uL (ref 0.00–0.07)
Basophils Absolute: 0.1 10*3/uL (ref 0.0–0.1)
Basophils Relative: 1 %
Eosinophils Absolute: 0.3 10*3/uL (ref 0.0–0.5)
Eosinophils Relative: 7 %
HCT: 41.2 % (ref 36.0–46.0)
Hemoglobin: 13.8 g/dL (ref 12.0–15.0)
Immature Granulocytes: 0 %
Lymphocytes Relative: 13 %
Lymphs Abs: 0.5 10*3/uL — ABNORMAL LOW (ref 0.7–4.0)
MCH: 30.1 pg (ref 26.0–34.0)
MCHC: 33.5 g/dL (ref 30.0–36.0)
MCV: 90 fL (ref 80.0–100.0)
Monocytes Absolute: 0.5 10*3/uL (ref 0.1–1.0)
Monocytes Relative: 13 %
Neutro Abs: 2.5 10*3/uL (ref 1.7–7.7)
Neutrophils Relative %: 66 %
Platelets: 253 10*3/uL (ref 150–400)
RBC: 4.58 MIL/uL (ref 3.87–5.11)
RDW: 13.2 % (ref 11.5–15.5)
WBC: 3.8 10*3/uL — ABNORMAL LOW (ref 4.0–10.5)
nRBC: 0 % (ref 0.0–0.2)

## 2020-04-07 MED ORDER — ACETAMINOPHEN 325 MG PO TABS
ORAL_TABLET | ORAL | Status: AC
  Filled 2020-04-07: qty 2

## 2020-04-07 MED ORDER — ACETAMINOPHEN 325 MG PO TABS
650.0000 mg | ORAL_TABLET | Freq: Once | ORAL | Status: AC
  Administered 2020-04-07: 650 mg via ORAL

## 2020-04-07 MED ORDER — DIPHENHYDRAMINE HCL 25 MG PO CAPS
ORAL_CAPSULE | ORAL | Status: AC
  Filled 2020-04-07: qty 2

## 2020-04-07 MED ORDER — DIPHENHYDRAMINE HCL 25 MG PO CAPS
25.0000 mg | ORAL_CAPSULE | Freq: Once | ORAL | Status: AC
  Administered 2020-04-07: 25 mg via ORAL

## 2020-04-07 MED ORDER — TRASTUZUMAB-DKST CHEMO 150 MG IV SOLR
6.0000 mg/kg | Freq: Once | INTRAVENOUS | Status: AC
  Administered 2020-04-07: 378 mg via INTRAVENOUS
  Filled 2020-04-07: qty 18

## 2020-04-07 NOTE — Patient Instructions (Signed)
[facility] Discharge Instructions for Patients Receiving Chemotherapy  Today you received the following immunotherapy agent: Trastuzumab (Herceptin)  To help prevent nausea and vomiting after your treatment, we encourage you to take your nausea medication as directed by your MD.   If you develop nausea and vomiting that is not controlled by your nausea medication, call the clinic.   BELOW ARE SYMPTOMS THAT SHOULD BE REPORTED IMMEDIATELY:  *FEVER GREATER THAN 100.5 F  *CHILLS WITH OR WITHOUT FEVER  NAUSEA AND VOMITING THAT IS NOT CONTROLLED WITH YOUR NAUSEA MEDICATION  *UNUSUAL SHORTNESS OF BREATH  *UNUSUAL BRUISING OR BLEEDING  TENDERNESS IN MOUTH AND THROAT WITH OR WITHOUT PRESENCE OF ULCERS  *URINARY PROBLEMS  *BOWEL PROBLEMS  UNUSUAL RASH Items with * indicate a potential emergency and should be followed up as soon as possible.  Feel free to call the clinic should you have any questions or concerns. The clinic phone number is [phone].  Please show the [place] at check-in to the Emergency Department and triage nurse.

## 2020-04-07 NOTE — Progress Notes (Signed)
Pt discharged in no apparent distress. Pt left ambulatory without assistance. Pt aware of discharge instructions and verbalized understanding and had no further questions.  

## 2020-04-08 ENCOUNTER — Telehealth: Payer: Self-pay | Admitting: Oncology

## 2020-04-08 NOTE — Telephone Encounter (Signed)
Scheduled appts per 2/15 sch msg. Pt to get updated appt calendar at next visit per appt notes.

## 2020-04-09 ENCOUNTER — Other Ambulatory Visit: Payer: Self-pay

## 2020-04-09 ENCOUNTER — Ambulatory Visit (HOSPITAL_COMMUNITY): Payer: Medicare Other | Attending: Internal Medicine

## 2020-04-09 DIAGNOSIS — Z01818 Encounter for other preprocedural examination: Secondary | ICD-10-CM | POA: Diagnosis not present

## 2020-04-09 DIAGNOSIS — Z170 Estrogen receptor positive status [ER+]: Secondary | ICD-10-CM | POA: Insufficient documentation

## 2020-04-09 DIAGNOSIS — C50412 Malignant neoplasm of upper-outer quadrant of left female breast: Secondary | ICD-10-CM | POA: Diagnosis not present

## 2020-04-09 DIAGNOSIS — I358 Other nonrheumatic aortic valve disorders: Secondary | ICD-10-CM | POA: Insufficient documentation

## 2020-04-09 DIAGNOSIS — R42 Dizziness and giddiness: Secondary | ICD-10-CM | POA: Insufficient documentation

## 2020-04-09 DIAGNOSIS — E785 Hyperlipidemia, unspecified: Secondary | ICD-10-CM | POA: Diagnosis not present

## 2020-04-09 DIAGNOSIS — I340 Nonrheumatic mitral (valve) insufficiency: Secondary | ICD-10-CM | POA: Diagnosis not present

## 2020-04-09 LAB — ECHOCARDIOGRAM COMPLETE
Area-P 1/2: 4.54 cm2
S' Lateral: 2.8 cm

## 2020-04-09 NOTE — Progress Notes (Signed)
  Patient Name: Holly Leon MRN: [id number] DOB: 28-Feb-1935 Referring Physician: Jovita Kussmaul (Profile Not Attached) Date of Service: 04/06/2020 [facility], [place]                                                        End Of Treatment Note  Diagnoses: C50.412-Malignant neoplasm of upper-outer quadrant of left female breast Z17.0-Estrogen receptor positive status [ER+]  Cancer Staging: At least Stage IA, pT1bNxM0 grade 3 ER positive, HER2 amplified invasive ductal carcinoma of the left breast.  Intent: Curative  Radiation Treatment Dates: 03/10/2020 through 04/06/2020 Site Technique Total Dose (Gy) Dose per Fx (Gy) Completed Fx Beam Energies  Breast, Left: Breast_Lt 3D 42.56/42.56 2.66 16/16 6X  Breast, Left: Breast_Lt_Bst specialPort 8/8 2 4/4 12E, 15E   Narrative: The patient tolerated radiation therapy relatively well. She developed anticipated skin changes of the breast.  Plan: The patient will receive a call in about one month from the radiation oncology department. She will continue follow up with Dr. Jana Hakim as well.  ________________________________________________    Carola Rhine, [facility]

## 2020-04-13 ENCOUNTER — Telehealth: Payer: Self-pay

## 2020-04-13 ENCOUNTER — Telehealth: Payer: Self-pay | Admitting: *Deleted

## 2020-04-13 NOTE — Telephone Encounter (Signed)
Patient states shecompleted RTon Monday and had first herceptin on Tuesday last week . She noted a rash on her chest on Thursday. It has now seemed to spread under her arm. Has been using a cream to help with itching.   Will come to see Sandi Mealy, PA tomorrow at [phone]

## 2020-04-13 NOTE — Telephone Encounter (Signed)
Call attempt regarding message pt left. Pt states she had a rash on her arm and breast. Will try again .

## 2020-04-14 ENCOUNTER — Other Ambulatory Visit: Payer: Self-pay

## 2020-04-14 ENCOUNTER — Inpatient Hospital Stay (HOSPITAL_BASED_OUTPATIENT_CLINIC_OR_DEPARTMENT_OTHER): Payer: Medicare Other | Admitting: Medical

## 2020-04-14 VITALS — BP 152/71 | HR 89 | Temp 97.0°F | Resp 17 | Ht 61.0 in | Wt 142.1 lb

## 2020-04-14 DIAGNOSIS — E785 Hyperlipidemia, unspecified: Secondary | ICD-10-CM | POA: Diagnosis not present

## 2020-04-14 DIAGNOSIS — R21 Rash and other nonspecific skin eruption: Secondary | ICD-10-CM

## 2020-04-14 DIAGNOSIS — Z5112 Encounter for antineoplastic immunotherapy: Secondary | ICD-10-CM | POA: Diagnosis not present

## 2020-04-14 DIAGNOSIS — C50412 Malignant neoplasm of upper-outer quadrant of left female breast: Secondary | ICD-10-CM

## 2020-04-14 DIAGNOSIS — Z170 Estrogen receptor positive status [ER+]: Secondary | ICD-10-CM

## 2020-04-14 DIAGNOSIS — Z7982 Long term (current) use of aspirin: Secondary | ICD-10-CM | POA: Diagnosis not present

## 2020-04-14 DIAGNOSIS — M810 Age-related osteoporosis without current pathological fracture: Secondary | ICD-10-CM | POA: Diagnosis not present

## 2020-04-14 MED ORDER — CLOTRIMAZOLE-BETAMETHASONE 1-0.05 % EX LOTN: TOPICAL_LOTION | Freq: Two times a day (BID) | CUTANEOUS | 2 refills | Status: DC

## 2020-04-16 DIAGNOSIS — R739 Hyperglycemia, unspecified: Secondary | ICD-10-CM | POA: Diagnosis not present

## 2020-04-16 DIAGNOSIS — E785 Hyperlipidemia, unspecified: Secondary | ICD-10-CM | POA: Diagnosis not present

## 2020-04-16 DIAGNOSIS — M810 Age-related osteoporosis without current pathological fracture: Secondary | ICD-10-CM | POA: Diagnosis not present

## 2020-04-17 NOTE — Progress Notes (Signed)
[facility] Progress Note   Holly Leon [id number] 1935-09-23 85 y.o.  Holly Leon is managed by Dr. Lurline Del  Actively treated with chemotherapy/immunotherapy/hormonal therapy: yes  Current therapy: Ogivri  Last treated: 04/07/2020 (cycle 4, day 1)  Next scheduled appointment with provider: 08/11/2020  Assessment: Plan:    Rash - Plan: clotrimazole-betamethasone (LOTRISONE) lotion  Malignant neoplasm of upper-outer quadrant of left breast in female, estrogen receptor positive ([place])   Rash in the left upper chest and left axilla: Patient was given prescription for Lotrisone lotion.  ER positive malignant neoplasm of the left breast: The patient is managed by Dr. Jana Hakim and is status post cycle 4, day 1 of Ogivri.  She is scheduled to be seen by Dr. Jana Hakim in follow-up on 08/11/2020 with multiple appointments for Ogivri dosing prior to her return already scheduled.  Please see After Visit Summary for patient specific instructions.  Future Appointments  Date Time Provider [place]  04/28/2020  8:00 AM [facility] LAB [facility] None  04/28/2020  8:45 AM [facility] INFUSION [facility] None  05/11/2020  2:00 PM [facility] None  05/19/2020  8:00 AM [facility] LAB [facility] None  05/19/2020  9:00 AM [facility] INFUSION [facility] None  06/09/2020  8:00 AM [facility] LAB [facility] None  06/09/2020  8:45 AM [facility] INFUSION [facility] None  06/30/2020  8:30 AM [facility] LAB [facility] None  06/30/2020  9:15 AM [facility] INFUSION [facility] None  07/21/2020  8:00 AM [facility] LAB [facility] None  07/21/2020  9:00 AM [facility] INFUSION [facility] None  08/11/2020 11:00 AM [facility] LAB [facility] None  08/11/2020 11:30 AM Magrinat, Virgie Dad, MD [facility] None  08/11/2020 12:30 PM [facility] INFUSION [facility] None    No orders of the defined types were placed in this  encounter.      Subjective:   Patient ID:  Holly Leon is a 85 y.o. (DOB 25-Jan-1936) female.  Chief Complaint: No chief complaint on file.   HPI Holly Leon  is a 85 y.o. female with a diagnosis of an ER positive malignant neoplasm of the left breast.  She is followed by Dr. Jana Hakim and is status post cycle 4, day 1 of Ogivri.  She presents to the clinic today with a report of a rash in her left chest and left axilla.  She finished radiation last Monday and was treated with Ogivri last Tuesday.  She noticed this rash approximately 1 week ago.  The rash is itching.  She has been using over-the-counter hydrocortisone cream without relief.  She denies any other issues of concern.  Medications: I have reviewed the patient's current medications.  Allergies:  Allergies  Allergen Reactions  . Lipitor [Atorvastatin] Hives    Past Medical History:  Diagnosis Date  . History of left breast cancer   . Hyperlipidemia   . Osteoporosis   . Postural dizziness with presyncope     Past Surgical History:  Procedure Laterality Date  . BREAST LUMPECTOMY WITH RADIOACTIVE SEED LOCALIZATION Left 01/22/2020   Procedure: LEFT BREAST LUMPECTOMY WITH RADIOACTIVE SEED LOCALIZATION;  Surgeon: Jovita Kussmaul, MD;  Location: [facility];  Service: General;  Laterality: Left;  . TONSILLECTOMY    . TUBAL LIGATION      Family History  Problem Relation Age of Onset  . Colon cancer Neg Hx   . Esophageal cancer Neg Hx   . Rectal cancer Neg Hx   . Stomach cancer Neg Hx     Social History   Socioeconomic History  .  Marital status: Widowed    Spouse name: Not on file  . Number of children: Not on file  . Years of education: Not on file  . Highest education level: Not on file  Occupational History  . Not on file  Tobacco Use  . Smoking status: Never Smoker  . Smokeless tobacco: Never Used  Vaping Use  . Vaping Use: Never used  Substance and Sexual Activity  . Alcohol  use: No  . Drug use: No  . Sexual activity: Not on file  Other Topics Concern  . Not on file  Social History Narrative  . Not on file   Social Determinants of Health   Financial Resource Strain: Not on file  Food Insecurity: Not on file  Transportation Needs: Not on file  Physical Activity: Not on file  Stress: Not on file  Social Connections: Not on file  Intimate Partner Violence: Not on file    Past Medical History, Surgical history, Social history, and Family history were reviewed and updated as appropriate.   Please see review of systems for further details on the patient's review from today.   Review of Systems:  Review of Systems  Constitutional: Negative for chills, diaphoresis and fever.  HENT: Negative for facial swelling and trouble swallowing.   Respiratory: Negative for cough, chest tightness and shortness of breath.   Cardiovascular: Negative for chest pain.  Gastrointestinal: Negative for constipation, diarrhea, nausea and vomiting.  Skin: Positive for rash.    Objective:   Physical Exam:  BP (!) 152/71 (BP Location: Left Arm, Patient Position: Sitting)   Pulse 89   Temp (!) 97 F (36.1 C) (Tympanic)   Resp 17   Ht '5\' 1"'  (1.549 m)   Wt 142 lb 1.6 oz (64.5 kg)   SpO2 100%   BMI 26.85 kg/m  ECOG: 0  Physical Exam Constitutional:      General: She is not in acute distress.    Appearance: Normal appearance. She is not ill-appearing, toxic-appearing or diaphoretic.  HENT:     Head: Normocephalic and atraumatic.  Eyes:     General: No scleral icterus.       Right eye: No discharge.        Left eye: No discharge.     Conjunctiva/sclera: Conjunctivae normal.  Skin:    Findings: Erythema and rash present.     Comments: There is an area of erythema and scaling in the left upper chest consistent with a radiation field.  There is a rash in the left axilla that is cherry red with satellite lesions.  This appears to be consistent with cutaneous  candidiasis.  Neurological:     Mental Status: She is alert.     Coordination: Coordination normal.     Gait: Gait normal.  Psychiatric:        Mood and Affect: Mood normal.        Behavior: Behavior normal.        Thought Content: Thought content normal.        Judgment: Judgment normal.     Lab Review:     Component Value Date/Time   NA 136 04/07/2020 0739   K 4.0 04/07/2020 0739   CL 105 04/07/2020 0739   CO2 23 04/07/2020 0739   GLUCOSE 112 (H) 04/07/2020 0739   BUN 15 04/07/2020 0739   CREATININE 1.01 (H) 04/07/2020 0739   CREATININE 0.87 12/10/2019 1448   CALCIUM 9.8 04/07/2020 0739   PROT 8.0 04/07/2020 0739  ALBUMIN 3.9 04/07/2020 0739   AST 23 04/07/2020 0739   AST 16 12/10/2019 1448   ALT 27 04/07/2020 0739   ALT 15 12/10/2019 1448   ALKPHOS 89 04/07/2020 0739   BILITOT 0.7 04/07/2020 0739   BILITOT 0.5 12/10/2019 1448   GFRNONAA 56 (L) 04/07/2020 0739   GFRNONAA >60 12/10/2019 1448   GFRAA  03/24/2008 1642    >60        The eGFR has been calculated using the MDRD equation. This calculation has not been validated in all clinical situations. eGFR's persistently <60 mL/min signify possible Chronic Kidney Disease.       Component Value Date/Time   WBC 3.8 (L) 04/07/2020 0739   RBC 4.58 04/07/2020 0739   HGB 13.8 04/07/2020 0739   HGB 13.5 12/10/2019 1448   HCT 41.2 04/07/2020 0739   PLT 253 04/07/2020 0739   PLT 317 12/10/2019 1448   MCV 90.0 04/07/2020 0739   MCH 30.1 04/07/2020 0739   MCHC 33.5 04/07/2020 0739   RDW 13.2 04/07/2020 0739   LYMPHSABS 0.5 (L) 04/07/2020 0739   MONOABS 0.5 04/07/2020 0739   EOSABS 0.3 04/07/2020 0739   BASOSABS 0.1 04/07/2020 0739   -------------------------------  Imaging from last 24 hours (if applicable):  Radiology interpretation: ECHOCARDIOGRAM COMPLETE  Result Date: 04/09/2020    ECHOCARDIOGRAM REPORT   Patient Name:   Holly Leon Date of Exam: 04/09/2020 Medical Rec #:  [id number]           Height:       61.0 in Accession #:    [id number]         Weight:       141.5 lb Date of Birth:  [birth year]         BSA:          1.630 m Patient Age:    89 years           BP:           146/84 mmHg Patient Gender: F                  HR:           82 bpm. Exam Location:  [facility] Procedure: 2D Echo, Cardiac Doppler and Color Doppler Indications:    C50.412 malignant neoplasm of upper-outer quadrant of left                 breast in female  History:        Patient has prior history of Echocardiogram examinations, most                 recent 01/02/2020. Signs/Symptoms:Dizziness/Lightheadedness and                 Presyncope; Risk Factors:HLD.  Sonographer:    Marygrace Drought RCS Referring Phys: [place]  1. Global longitudinal strain is -17.3% (compared to -19.0% from echo 01/02/20). Left ventricular ejection fraction, by estimation, is 65 to 70%. The left ventricle has normal function. The left ventricle has no regional wall motion abnormalities. Left ventricular diastolic parameters are indeterminate.  2. Right ventricular systolic function is normal. The right ventricular size is normal. There is normal pulmonary artery systolic pressure.  3. The mitral valve is abnormal. Mild mitral valve regurgitation.  4. The aortic valve is moderately sclerotic No significant stenosis No AI FINDINGS  Left Ventricle: Global longitudinal strain is -17.3% (compared to -19.0% from echo 01/02/20). Left ventricular ejection fraction,  by estimation, is 65 to 70%. The left ventricle has normal function. The left ventricle has no regional wall motion abnormalities. The left ventricular internal cavity size was normal in size. There is no left ventricular hypertrophy. Left ventricular diastolic parameters are indeterminate. Right Ventricle: The right ventricular size is normal. Right vetricular wall thickness was not assessed. Right ventricular systolic function is normal. There is normal pulmonary artery  systolic pressure. The tricuspid regurgitant velocity is 2.55 m/s, and with an assumed right atrial pressure of 3 mmHg, the estimated right ventricular systolic pressure is 05.6 mmHg. Left Atrium: Left atrial size was normal in size. Right Atrium: Right atrial size was normal in size. Pericardium: There is no evidence of pericardial effusion. Mitral Valve: The mitral valve is abnormal. Mild mitral annular calcification. Mild mitral valve regurgitation. Tricuspid Valve: The tricuspid valve is normal in structure. Tricuspid valve regurgitation is mild. Aortic Valve: The aortic valve is abnormal. Aortic valve regurgitation is not visualized. Moderately sclerotic. Pulmonic Valve: The pulmonic valve was grossly normal. Pulmonic valve regurgitation is not visualized. Aorta: The aortic root and ascending aorta are structurally normal, with no evidence of dilitation. IAS/Shunts: The interatrial septum was not assessed.  LEFT VENTRICLE PLAX 2D LVIDd:         4.10 cm  Diastology LVIDs:         2.80 cm  LV e' medial:    6.53 cm/s LV PW:         0.80 cm  LV E/e' medial:  15.3 LV IVS:        0.70 cm  LV e' lateral:   5.98 cm/s LVOT diam:     1.70 cm  LV E/e' lateral: 16.7 LV SV:         45 LV SV Index:   27       2D Longitudinal Strain LVOT Area:     2.27 cm 2D Strain GLS (A2C):   -15.7 %                         2D Strain GLS (A3C):   -19.2 %                         2D Strain GLS (A4C):   -16.9 %                         2D Strain GLS Avg:     -17.3 % RIGHT VENTRICLE RV Basal diam:  3.90 cm RV S prime:     11.30 cm/s TAPSE (M-mode): 2.4 cm RVSP:           29.0 mmHg LEFT ATRIUM             Index       RIGHT ATRIUM           Index LA diam:        3.20 cm 1.96 cm/m  RA Pressure: 3.00 mmHg LA Vol (A2C):   26.1 ml 16.01 ml/m RA Area:     11.20 cm LA Vol (A4C):   28.9 ml 17.73 ml/m RA Volume:   24.70 ml  15.15 ml/m LA Biplane Vol: 28.5 ml 17.48 ml/m  AORTIC VALVE LVOT Vmax:   88.80 cm/s LVOT Vmean:  62.500 cm/s LVOT VTI:     0.197 m  AORTA Ao Root diam: 2.80 cm Ao Asc diam:  3.10 cm MITRAL VALVE  TRICUSPID VALVE MV Area (PHT):              TR Peak grad:   26.0 mmHg MV Decel Time:              TR Vmax:        255.00 cm/s MV E velocity: 99.80 cm/s   Estimated RAP:  3.00 mmHg MV A velocity: 112.00 cm/s  RVSP:           29.0 mmHg MV E/A ratio:  0.89                             SHUNTS                             Systemic VTI:  0.20 m                             Systemic Diam: 1.70 cm Dorris Carnes MD Electronically signed by Dorris Carnes MD Signature Date/Time: 04/09/2020/6:08:14 PM    Final

## 2020-04-23 DIAGNOSIS — R82998 Other abnormal findings in urine: Secondary | ICD-10-CM | POA: Diagnosis not present

## 2020-04-23 DIAGNOSIS — I340 Nonrheumatic mitral (valve) insufficiency: Secondary | ICD-10-CM | POA: Diagnosis not present

## 2020-04-23 DIAGNOSIS — R739 Hyperglycemia, unspecified: Secondary | ICD-10-CM | POA: Diagnosis not present

## 2020-04-23 DIAGNOSIS — M810 Age-related osteoporosis without current pathological fracture: Secondary | ICD-10-CM | POA: Diagnosis not present

## 2020-04-23 DIAGNOSIS — C50919 Malignant neoplasm of unspecified site of unspecified female breast: Secondary | ICD-10-CM | POA: Diagnosis not present

## 2020-04-23 DIAGNOSIS — R030 Elevated blood-pressure reading, without diagnosis of hypertension: Secondary | ICD-10-CM | POA: Diagnosis not present

## 2020-04-23 DIAGNOSIS — M1990 Unspecified osteoarthritis, unspecified site: Secondary | ICD-10-CM | POA: Diagnosis not present

## 2020-04-23 DIAGNOSIS — E785 Hyperlipidemia, unspecified: Secondary | ICD-10-CM | POA: Diagnosis not present

## 2020-04-23 DIAGNOSIS — Z0000 Encounter for general adult medical examination without abnormal findings: Secondary | ICD-10-CM | POA: Diagnosis not present

## 2020-04-27 ENCOUNTER — Encounter: Payer: Self-pay | Admitting: *Deleted

## 2020-04-28 ENCOUNTER — Inpatient Hospital Stay: Payer: Medicare Other | Attending: Oncology

## 2020-04-28 ENCOUNTER — Other Ambulatory Visit: Payer: Self-pay

## 2020-04-28 ENCOUNTER — Inpatient Hospital Stay: Payer: Medicare Other

## 2020-04-28 VITALS — BP 132/57 | HR 79 | Temp 97.7°F | Resp 18 | Ht 61.0 in | Wt 140.8 lb

## 2020-04-28 DIAGNOSIS — C50412 Malignant neoplasm of upper-outer quadrant of left female breast: Secondary | ICD-10-CM

## 2020-04-28 DIAGNOSIS — Z923 Personal history of irradiation: Secondary | ICD-10-CM | POA: Diagnosis not present

## 2020-04-28 DIAGNOSIS — Z5112 Encounter for antineoplastic immunotherapy: Secondary | ICD-10-CM | POA: Insufficient documentation

## 2020-04-28 DIAGNOSIS — Z170 Estrogen receptor positive status [ER+]: Secondary | ICD-10-CM | POA: Insufficient documentation

## 2020-04-28 LAB — CBC WITH DIFFERENTIAL/PLATELET
Abs Immature Granulocytes: 0.01 10*3/uL (ref 0.00–0.07)
Basophils Absolute: 0 10*3/uL (ref 0.0–0.1)
Basophils Relative: 1 %
Eosinophils Absolute: 0.2 10*3/uL (ref 0.0–0.5)
Eosinophils Relative: 6 %
HCT: 38.9 % (ref 36.0–46.0)
Hemoglobin: 12.9 g/dL (ref 12.0–15.0)
Immature Granulocytes: 0 %
Lymphocytes Relative: 15 %
Lymphs Abs: 0.6 10*3/uL — ABNORMAL LOW (ref 0.7–4.0)
MCH: 30.1 pg (ref 26.0–34.0)
MCHC: 33.2 g/dL (ref 30.0–36.0)
MCV: 90.7 fL (ref 80.0–100.0)
Monocytes Absolute: 0.5 10*3/uL (ref 0.1–1.0)
Monocytes Relative: 12 %
Neutro Abs: 2.7 10*3/uL (ref 1.7–7.7)
Neutrophils Relative %: 66 %
Platelets: 244 10*3/uL (ref 150–400)
RBC: 4.29 MIL/uL (ref 3.87–5.11)
RDW: 13.2 % (ref 11.5–15.5)
WBC: 4.1 10*3/uL (ref 4.0–10.5)
nRBC: 0 % (ref 0.0–0.2)

## 2020-04-28 MED ORDER — SODIUM CHLORIDE 0.9 % IV SOLN
Freq: Once | INTRAVENOUS | Status: AC
  Filled 2020-04-28: qty 250

## 2020-04-28 MED ORDER — ACETAMINOPHEN 325 MG PO TABS
650.0000 mg | ORAL_TABLET | Freq: Once | ORAL | Status: AC
  Administered 2020-04-28: 650 mg via ORAL

## 2020-04-28 MED ORDER — DIPHENHYDRAMINE HCL 25 MG PO CAPS
25.0000 mg | ORAL_CAPSULE | Freq: Once | ORAL | Status: AC
  Administered 2020-04-28: 25 mg via ORAL

## 2020-04-28 MED ORDER — ACETAMINOPHEN 325 MG PO TABS
ORAL_TABLET | ORAL | Status: AC
  Filled 2020-04-28: qty 2

## 2020-04-28 MED ORDER — DIPHENHYDRAMINE HCL 25 MG PO CAPS
ORAL_CAPSULE | ORAL | Status: AC
  Filled 2020-04-28: qty 1

## 2020-04-28 MED ORDER — TRASTUZUMAB-DKST CHEMO 150 MG IV SOLR
6.0000 mg/kg | Freq: Once | INTRAVENOUS | Status: AC
  Administered 2020-04-28: 378 mg via INTRAVENOUS
  Filled 2020-04-28: qty 18

## 2020-04-28 NOTE — Progress Notes (Signed)
Patient reports to RN rash greatly improved and no longer itching.  Patient instructed to report if rash gets worse to review if drug related.  Henreitta Leber, Jacqulyn Bath, PharmD 04/28/20 @ [phone]

## 2020-04-28 NOTE — Patient Instructions (Signed)
[facility] Discharge Instructions for Patients Receiving Chemotherapy  Today you received the following chemotherapy agents: Trastuzumab (Herceptin).  To help prevent nausea and vomiting after your treatment, we encourage you to take your nausea medication as prescribed. If you develop nausea and vomiting that is not controlled by your nausea medication, call the clinic.   BELOW ARE SYMPTOMS THAT SHOULD BE REPORTED IMMEDIATELY:  *FEVER GREATER THAN 100.5 F  *CHILLS WITH OR WITHOUT FEVER  NAUSEA AND VOMITING THAT IS NOT CONTROLLED WITH YOUR NAUSEA MEDICATION  *UNUSUAL SHORTNESS OF BREATH  *UNUSUAL BRUISING OR BLEEDING  TENDERNESS IN MOUTH AND THROAT WITH OR WITHOUT PRESENCE OF ULCERS  *URINARY PROBLEMS  *BOWEL PROBLEMS  UNUSUAL RASH Items with * indicate a potential emergency and should be followed up as soon as possible.  Feel free to call the clinic should you have any questions or concerns. The clinic phone number is [phone].  Please show the CHEMO ALERT CARD at check-in to the Emergency Department and triage nurse.   

## 2020-05-08 DIAGNOSIS — M810 Age-related osteoporosis without current pathological fracture: Secondary | ICD-10-CM | POA: Diagnosis not present

## 2020-05-08 DIAGNOSIS — M4302 Spondylolysis, cervical region: Secondary | ICD-10-CM | POA: Diagnosis not present

## 2020-05-11 ENCOUNTER — Other Ambulatory Visit: Payer: Self-pay

## 2020-05-11 ENCOUNTER — Ambulatory Visit
Admission: RE | Admit: 2020-05-11 | Discharge: 2020-05-11 | Disposition: A | Discharge status: Home / Self Care | Payer: Medicare Other | Source: Ambulatory Visit | Attending: Radiation Oncology | Admitting: Radiation Oncology

## 2020-05-11 DIAGNOSIS — Z170 Estrogen receptor positive status [ER+]: Secondary | ICD-10-CM | POA: Insufficient documentation

## 2020-05-11 DIAGNOSIS — C50412 Malignant neoplasm of upper-outer quadrant of left female breast: Secondary | ICD-10-CM | POA: Insufficient documentation

## 2020-05-11 NOTE — Progress Notes (Signed)
Test

## 2020-05-11 NOTE — Progress Notes (Signed)
One month post treatment telephone call. Patient denies having any fatigue. Patient states still having a slight rash and is using the radiaplex gel that was given during treatment. Patient states  the rash is doing better. Advised patient that skin is sensitive for about a year after treatment and will have the potential to burn and blister easily. Advised patient to use sunscreen with an SPF of 30 or higher and wear protective clothing if in the sun. Patient verbalized understanding. Patient states that she is following up with Dr. Jana Hakim in medical oncology.

## 2020-05-19 ENCOUNTER — Inpatient Hospital Stay: Payer: Medicare Other

## 2020-05-19 ENCOUNTER — Other Ambulatory Visit: Payer: Self-pay

## 2020-05-19 ENCOUNTER — Encounter: Payer: Self-pay | Admitting: Oncology

## 2020-05-19 VITALS — BP 148/70 | HR 79 | Temp 97.5°F | Resp 18 | Wt 142.2 lb

## 2020-05-19 DIAGNOSIS — Z170 Estrogen receptor positive status [ER+]: Secondary | ICD-10-CM | POA: Diagnosis not present

## 2020-05-19 DIAGNOSIS — C50412 Malignant neoplasm of upper-outer quadrant of left female breast: Secondary | ICD-10-CM

## 2020-05-19 DIAGNOSIS — Z923 Personal history of irradiation: Secondary | ICD-10-CM | POA: Diagnosis not present

## 2020-05-19 DIAGNOSIS — Z5112 Encounter for antineoplastic immunotherapy: Secondary | ICD-10-CM | POA: Diagnosis not present

## 2020-05-19 LAB — COMPREHENSIVE METABOLIC PANEL
ALT: 24 U/L (ref 0–44)
AST: 18 U/L (ref 15–41)
Albumin: 3.7 g/dL (ref 3.5–5.0)
Alkaline Phosphatase: 88 U/L (ref 38–126)
Anion gap: 13 (ref 5–15)
BUN: 15 mg/dL (ref 8–23)
CO2: 20 mmol/L — ABNORMAL LOW (ref 22–32)
Calcium: 9.2 mg/dL (ref 8.9–10.3)
Chloride: 106 mmol/L (ref 98–111)
Creatinine, Ser: 0.92 mg/dL (ref 0.44–1.00)
GFR, Estimated: >60 mL/min (ref >60)
Glucose, Bld: 116 mg/dL — ABNORMAL HIGH (ref 70–99)
Potassium: 3.9 mmol/L (ref 3.5–5.1)
Sodium: 139 mmol/L (ref 135–145)
Total Bilirubin: 0.3 mg/dL (ref 0.3–1.2)
Total Protein: 7.9 g/dL (ref 6.5–8.1)

## 2020-05-19 LAB — CBC WITH DIFFERENTIAL/PLATELET
Abs Immature Granulocytes: 0.01 10*3/uL (ref 0.00–0.07)
Basophils Absolute: 0.1 10*3/uL (ref 0.0–0.1)
Basophils Relative: 1 %
Eosinophils Absolute: 0.3 10*3/uL (ref 0.0–0.5)
Eosinophils Relative: 6 %
HCT: 39.3 % (ref 36.0–46.0)
Hemoglobin: 13.5 g/dL (ref 12.0–15.0)
Immature Granulocytes: 0 %
Lymphocytes Relative: 14 %
Lymphs Abs: 0.7 10*3/uL (ref 0.7–4.0)
MCH: 30.3 pg (ref 26.0–34.0)
MCHC: 34.4 g/dL (ref 30.0–36.0)
MCV: 88.3 fL (ref 80.0–100.0)
Monocytes Absolute: 0.5 10*3/uL (ref 0.1–1.0)
Monocytes Relative: 11 %
Neutro Abs: 3.2 10*3/uL (ref 1.7–7.7)
Neutrophils Relative %: 68 %
Platelets: 247 10*3/uL (ref 150–400)
RBC: 4.45 MIL/uL (ref 3.87–5.11)
RDW: 12.6 % (ref 11.5–15.5)
WBC: 4.7 10*3/uL (ref 4.0–10.5)
nRBC: 0 % (ref 0.0–0.2)

## 2020-05-19 MED ORDER — ACETAMINOPHEN 325 MG PO TABS
ORAL_TABLET | ORAL | Status: AC
  Filled 2020-05-19: qty 2

## 2020-05-19 MED ORDER — DIPHENHYDRAMINE HCL 25 MG PO CAPS
ORAL_CAPSULE | ORAL | Status: AC
  Filled 2020-05-19: qty 1

## 2020-05-19 MED ORDER — DIPHENHYDRAMINE HCL 25 MG PO CAPS
25.0000 mg | ORAL_CAPSULE | Freq: Once | ORAL | Status: AC
  Administered 2020-05-19: 25 mg via ORAL

## 2020-05-19 MED ORDER — SODIUM CHLORIDE 0.9 % IV SOLN
Freq: Once | INTRAVENOUS | Status: AC
  Filled 2020-05-19: qty 250

## 2020-05-19 MED ORDER — ACETAMINOPHEN 325 MG PO TABS
650.0000 mg | ORAL_TABLET | Freq: Once | ORAL | Status: AC
  Administered 2020-05-19: 650 mg via ORAL

## 2020-05-19 MED ORDER — TRASTUZUMAB-DKST CHEMO 150 MG IV SOLR
6.0000 mg/kg | Freq: Once | INTRAVENOUS | Status: AC
  Administered 2020-05-19: 378 mg via INTRAVENOUS
  Filled 2020-05-19: qty 18

## 2020-05-19 NOTE — Progress Notes (Signed)
Patient called as she was given my card per my request.  Introduced myself as Arboriculturist and to offer available resources.  Discussed one-time $1000 Radio broadcast assistant to assist with personal expenses while going through treatment. Based on verbal income guidelines, she states she has been blessed and does not need.  She has my card for any additional financial questions or concerns.

## 2020-05-19 NOTE — Patient Instructions (Signed)
   [facility] Discharge Instructions for Patients Receiving Chemotherapy  Today you received the following chemotherapy agents Traztuzumab(Ogivri)  To help prevent nausea and vomiting after your treatment, we encourage you to take your nausea medication as directed.   If you develop nausea and vomiting that is not controlled by your nausea medication, call the clinic.   BELOW ARE SYMPTOMS THAT SHOULD BE REPORTED IMMEDIATELY:  *FEVER GREATER THAN 100.5 F  *CHILLS WITH OR WITHOUT FEVER  NAUSEA AND VOMITING THAT IS NOT CONTROLLED WITH YOUR NAUSEA MEDICATION  *UNUSUAL SHORTNESS OF BREATH  *UNUSUAL BRUISING OR BLEEDING  TENDERNESS IN MOUTH AND THROAT WITH OR WITHOUT PRESENCE OF ULCERS  *URINARY PROBLEMS  *BOWEL PROBLEMS  UNUSUAL RASH Items with * indicate a potential emergency and should be followed up as soon as possible.  Feel free to call the clinic should you have any questions or concerns. The clinic phone number is [phone].  Please show the [place] at check-in to the Emergency Department and triage nurse.

## 2020-05-29 ENCOUNTER — Other Ambulatory Visit (HOSPITAL_COMMUNITY): Payer: Self-pay | Admitting: *Deleted

## 2020-06-01 ENCOUNTER — Ambulatory Visit (HOSPITAL_COMMUNITY)
Admission: RE | Admit: 2020-06-01 | Discharge: 2020-06-01 | Disposition: A | Discharge status: Home / Self Care | Payer: Medicare Other | Source: Ambulatory Visit | Attending: Internal Medicine | Admitting: Internal Medicine

## 2020-06-01 ENCOUNTER — Other Ambulatory Visit: Payer: Self-pay

## 2020-06-01 DIAGNOSIS — M810 Age-related osteoporosis without current pathological fracture: Secondary | ICD-10-CM | POA: Insufficient documentation

## 2020-06-01 MED ORDER — DENOSUMAB 60 MG/ML ~~LOC~~ SOSY: 60.0000 mg | PREFILLED_SYRINGE | Freq: Once | SUBCUTANEOUS | Status: DC

## 2020-06-01 MED ORDER — DENOSUMAB 60 MG/ML ~~LOC~~ SOSY
PREFILLED_SYRINGE | SUBCUTANEOUS | Status: AC
  Administered 2020-06-01: 60 mg
  Filled 2020-06-01: qty 1

## 2020-06-09 ENCOUNTER — Inpatient Hospital Stay: Payer: Medicare Other

## 2020-06-09 ENCOUNTER — Inpatient Hospital Stay: Payer: Medicare Other | Attending: Oncology

## 2020-06-09 ENCOUNTER — Other Ambulatory Visit: Payer: Self-pay

## 2020-06-09 VITALS — BP 162/78 | HR 83 | Temp 97.5°F | Resp 16 | Wt 140.8 lb

## 2020-06-09 DIAGNOSIS — C50412 Malignant neoplasm of upper-outer quadrant of left female breast: Secondary | ICD-10-CM | POA: Insufficient documentation

## 2020-06-09 DIAGNOSIS — Z5112 Encounter for antineoplastic immunotherapy: Secondary | ICD-10-CM | POA: Insufficient documentation

## 2020-06-09 DIAGNOSIS — Z170 Estrogen receptor positive status [ER+]: Secondary | ICD-10-CM

## 2020-06-09 DIAGNOSIS — Z923 Personal history of irradiation: Secondary | ICD-10-CM | POA: Insufficient documentation

## 2020-06-09 LAB — COMPREHENSIVE METABOLIC PANEL
ALT: 18 U/L (ref 0–44)
AST: 18 U/L (ref 15–41)
Albumin: 3.8 g/dL (ref 3.5–5.0)
Alkaline Phosphatase: 88 U/L (ref 38–126)
Anion gap: 11 (ref 5–15)
BUN: 12 mg/dL (ref 8–23)
CO2: 23 mmol/L (ref 22–32)
Calcium: 9.1 mg/dL (ref 8.9–10.3)
Chloride: 107 mmol/L (ref 98–111)
Creatinine, Ser: 0.97 mg/dL (ref 0.44–1.00)
GFR, Estimated: 58 mL/min — ABNORMAL LOW (ref >60)
Glucose, Bld: 116 mg/dL — ABNORMAL HIGH (ref 70–99)
Potassium: 4 mmol/L (ref 3.5–5.1)
Sodium: 141 mmol/L (ref 135–145)
Total Bilirubin: 0.4 mg/dL (ref 0.3–1.2)
Total Protein: 8 g/dL (ref 6.5–8.1)

## 2020-06-09 LAB — CBC WITH DIFFERENTIAL/PLATELET
Abs Immature Granulocytes: 0.01 10*3/uL (ref 0.00–0.07)
Basophils Absolute: 0 10*3/uL (ref 0.0–0.1)
Basophils Relative: 1 %
Eosinophils Absolute: 0.3 10*3/uL (ref 0.0–0.5)
Eosinophils Relative: 5 %
HCT: 41.6 % (ref 36.0–46.0)
Hemoglobin: 14 g/dL (ref 12.0–15.0)
Immature Granulocytes: 0 %
Lymphocytes Relative: 14 %
Lymphs Abs: 0.7 10*3/uL (ref 0.7–4.0)
MCH: 29.9 pg (ref 26.0–34.0)
MCHC: 33.7 g/dL (ref 30.0–36.0)
MCV: 88.7 fL (ref 80.0–100.0)
Monocytes Absolute: 0.5 10*3/uL (ref 0.1–1.0)
Monocytes Relative: 11 %
Neutro Abs: 3.4 10*3/uL (ref 1.7–7.7)
Neutrophils Relative %: 69 %
Platelets: 273 10*3/uL (ref 150–400)
RBC: 4.69 MIL/uL (ref 3.87–5.11)
RDW: 12.4 % (ref 11.5–15.5)
WBC: 4.9 10*3/uL (ref 4.0–10.5)
nRBC: 0 % (ref 0.0–0.2)

## 2020-06-09 MED ORDER — ACETAMINOPHEN 325 MG PO TABS
ORAL_TABLET | ORAL | Status: AC
  Filled 2020-06-09: qty 2

## 2020-06-09 MED ORDER — ACETAMINOPHEN 325 MG PO TABS
650.0000 mg | ORAL_TABLET | Freq: Once | ORAL | Status: AC
  Administered 2020-06-09: 650 mg via ORAL

## 2020-06-09 MED ORDER — DIPHENHYDRAMINE HCL 25 MG PO CAPS
ORAL_CAPSULE | ORAL | Status: AC
  Filled 2020-06-09: qty 1

## 2020-06-09 MED ORDER — HEPARIN SOD (PORK) LOCK FLUSH 100 UNIT/ML IV SOLN
500.0000 [IU] | Freq: Once | INTRAVENOUS | Status: DC | PRN
  Filled 2020-06-09: qty 5

## 2020-06-09 MED ORDER — SODIUM CHLORIDE 0.9 % IV SOLN
Freq: Once | INTRAVENOUS | Status: AC
Start: 2020-06-09 — End: 2020-06-09
  Filled 2020-06-09: qty 250

## 2020-06-09 MED ORDER — TRASTUZUMAB-DKST CHEMO 150 MG IV SOLR
6.0000 mg/kg | Freq: Once | INTRAVENOUS | Status: AC
  Administered 2020-06-09: 378 mg via INTRAVENOUS
  Filled 2020-06-09: qty 18

## 2020-06-09 MED ORDER — SODIUM CHLORIDE 0.9% FLUSH
10.0000 mL | INTRAVENOUS | Status: DC | PRN
  Filled 2020-06-09: qty 10

## 2020-06-09 MED ORDER — DIPHENHYDRAMINE HCL 25 MG PO CAPS
25.0000 mg | ORAL_CAPSULE | Freq: Once | ORAL | Status: AC
  Administered 2020-06-09: 25 mg via ORAL

## 2020-06-09 NOTE — Patient Instructions (Signed)
[facility] Discharge Instructions for Patients Receiving Chemotherapy  Today you received the following chemotherapy agents: TRASTUZUMAB.  To help prevent nausea and vomiting after your treatment, we encourage you to take your nausea medication as directed.   If you develop nausea and vomiting that is not controlled by your nausea medication, call the clinic.   BELOW ARE SYMPTOMS THAT SHOULD BE REPORTED IMMEDIATELY:  *FEVER GREATER THAN 100.5 F  *CHILLS WITH OR WITHOUT FEVER  NAUSEA AND VOMITING THAT IS NOT CONTROLLED WITH YOUR NAUSEA MEDICATION  *UNUSUAL SHORTNESS OF BREATH  *UNUSUAL BRUISING OR BLEEDING  TENDERNESS IN MOUTH AND THROAT WITH OR WITHOUT PRESENCE OF ULCERS  *URINARY PROBLEMS  *BOWEL PROBLEMS  UNUSUAL RASH Items with * indicate a potential emergency and should be followed up as soon as possible.  Feel free to call the clinic should you have any questions or concerns. The clinic phone number is [phone].  Please show the [place] at check-in to the Emergency Department and triage nurse.

## 2020-06-29 ENCOUNTER — Other Ambulatory Visit: Payer: Self-pay | Admitting: *Deleted

## 2020-06-29 DIAGNOSIS — Z170 Estrogen receptor positive status [ER+]: Secondary | ICD-10-CM

## 2020-06-29 DIAGNOSIS — C50412 Malignant neoplasm of upper-outer quadrant of left female breast: Secondary | ICD-10-CM

## 2020-06-30 ENCOUNTER — Inpatient Hospital Stay: Payer: Medicare Other

## 2020-06-30 ENCOUNTER — Inpatient Hospital Stay: Payer: Medicare Other | Attending: Oncology

## 2020-06-30 ENCOUNTER — Other Ambulatory Visit: Payer: Self-pay

## 2020-06-30 VITALS — BP 154/74 | HR 80 | Temp 97.9°F | Resp 17 | Wt 138.5 lb

## 2020-06-30 DIAGNOSIS — C50412 Malignant neoplasm of upper-outer quadrant of left female breast: Secondary | ICD-10-CM | POA: Diagnosis not present

## 2020-06-30 DIAGNOSIS — Z170 Estrogen receptor positive status [ER+]: Secondary | ICD-10-CM

## 2020-06-30 DIAGNOSIS — Z5112 Encounter for antineoplastic immunotherapy: Secondary | ICD-10-CM | POA: Insufficient documentation

## 2020-06-30 DIAGNOSIS — Z7982 Long term (current) use of aspirin: Secondary | ICD-10-CM | POA: Insufficient documentation

## 2020-06-30 DIAGNOSIS — Z79899 Other long term (current) drug therapy: Secondary | ICD-10-CM | POA: Diagnosis not present

## 2020-06-30 LAB — CMP (CANCER CENTER ONLY)
ALT: 14 U/L (ref 0–44)
AST: 18 U/L (ref 15–41)
Albumin: 3.7 g/dL (ref 3.5–5.0)
Alkaline Phosphatase: 85 U/L (ref 38–126)
Anion gap: 9 (ref 5–15)
BUN: 12 mg/dL (ref 8–23)
CO2: 24 mmol/L (ref 22–32)
Calcium: 9.4 mg/dL (ref 8.9–10.3)
Chloride: 106 mmol/L (ref 98–111)
Creatinine: 0.96 mg/dL (ref 0.44–1.00)
GFR, Estimated: 59 mL/min — ABNORMAL LOW (ref >60)
Glucose, Bld: 107 mg/dL — ABNORMAL HIGH (ref 70–99)
Potassium: 4.3 mmol/L (ref 3.5–5.1)
Sodium: 139 mmol/L (ref 135–145)
Total Bilirubin: 0.5 mg/dL (ref 0.3–1.2)
Total Protein: 7.8 g/dL (ref 6.5–8.1)

## 2020-06-30 LAB — CBC WITH DIFFERENTIAL (CANCER CENTER ONLY)
Abs Immature Granulocytes: 0.01 10*3/uL (ref 0.00–0.07)
Basophils Absolute: 0 10*3/uL (ref 0.0–0.1)
Basophils Relative: 1 %
Eosinophils Absolute: 0.2 10*3/uL (ref 0.0–0.5)
Eosinophils Relative: 5 %
HCT: 40.4 % (ref 36.0–46.0)
Hemoglobin: 13.2 g/dL (ref 12.0–15.0)
Immature Granulocytes: 0 %
Lymphocytes Relative: 19 %
Lymphs Abs: 0.9 10*3/uL (ref 0.7–4.0)
MCH: 29.5 pg (ref 26.0–34.0)
MCHC: 32.7 g/dL (ref 30.0–36.0)
MCV: 90.4 fL (ref 80.0–100.0)
Monocytes Absolute: 0.5 10*3/uL (ref 0.1–1.0)
Monocytes Relative: 12 %
Neutro Abs: 2.9 10*3/uL (ref 1.7–7.7)
Neutrophils Relative %: 63 %
Platelet Count: 259 10*3/uL (ref 150–400)
RBC: 4.47 MIL/uL (ref 3.87–5.11)
RDW: 12.5 % (ref 11.5–15.5)
WBC Count: 4.5 10*3/uL (ref 4.0–10.5)
nRBC: 0 % (ref 0.0–0.2)

## 2020-06-30 MED ORDER — SODIUM CHLORIDE 0.9 % IV SOLN
Freq: Once | INTRAVENOUS | Status: AC
  Filled 2020-06-30: qty 250

## 2020-06-30 MED ORDER — DIPHENHYDRAMINE HCL 25 MG PO CAPS
ORAL_CAPSULE | ORAL | Status: AC
  Filled 2020-06-30: qty 1

## 2020-06-30 MED ORDER — DIPHENHYDRAMINE HCL 25 MG PO CAPS
25.0000 mg | ORAL_CAPSULE | Freq: Once | ORAL | Status: AC
  Administered 2020-06-30: 25 mg via ORAL

## 2020-06-30 MED ORDER — ACETAMINOPHEN 325 MG PO TABS
650.0000 mg | ORAL_TABLET | Freq: Once | ORAL | Status: AC
Start: 2020-06-30 — End: 2020-06-30
  Administered 2020-06-30: 650 mg via ORAL

## 2020-06-30 MED ORDER — TRASTUZUMAB-DKST CHEMO 150 MG IV SOLR
6.0000 mg/kg | Freq: Once | INTRAVENOUS | Status: AC
  Administered 2020-06-30: 378 mg via INTRAVENOUS
  Filled 2020-06-30: qty 18

## 2020-06-30 MED ORDER — ACETAMINOPHEN 325 MG PO TABS
ORAL_TABLET | ORAL | Status: AC
  Filled 2020-06-30: qty 2

## 2020-06-30 NOTE — Patient Instructions (Signed)
[facility] MEDICAL ONCOLOGY  Discharge Instructions: ?Thank you for choosing [facility] to provide your oncology and hematology care.  ? ?If you have a lab appointment with the Cancer Center, please go directly to the Cancer Center and check in at the registration area. ?  ?Wear comfortable clothing and clothing appropriate for easy access to any Portacath or PICC line.  ? ?We strive to give you quality time with your provider. You may need to reschedule your appointment if you arrive late (15 or more minutes).  Arriving late affects you and other patients whose appointments are after yours.  Also, if you miss three or more appointments without notifying the office, you may be dismissed from the clinic at the provider?s discretion.    ?  ?For prescription refill requests, have your pharmacy contact our office and allow 72 hours for refills to be completed.   ? ?Today you received the following chemotherapy and/or immunotherapy agents: Trastuzumab    ?  ?To help prevent nausea and vomiting after your treatment, we encourage you to take your nausea medication as directed. ? ?BELOW ARE SYMPTOMS THAT SHOULD BE REPORTED IMMEDIATELY: ?*FEVER GREATER THAN 100.4 F (38 ?C) OR HIGHER ?*CHILLS OR SWEATING ?*NAUSEA AND VOMITING THAT IS NOT CONTROLLED WITH YOUR NAUSEA MEDICATION ?*UNUSUAL SHORTNESS OF BREATH ?*UNUSUAL BRUISING OR BLEEDING ?*URINARY PROBLEMS (pain or burning when urinating, or frequent urination) ?*BOWEL PROBLEMS (unusual diarrhea, constipation, pain near the anus) ?TENDERNESS IN MOUTH AND THROAT WITH OR WITHOUT PRESENCE OF ULCERS (sore throat, sores in mouth, or a toothache) ?UNUSUAL RASH, SWELLING OR PAIN  ?UNUSUAL VAGINAL DISCHARGE OR ITCHING  ? ?Items with * indicate a potential emergency and should be followed up as soon as possible or go to the Emergency Department if any problems should occur. ? ?Please show the CHEMOTHERAPY ALERT CARD or IMMUNOTHERAPY ALERT CARD at check-in  to the Emergency Department and triage nurse. ? ?Should you have questions after your visit or need to cancel or reschedule your appointment, please contact [facility]  Dept: [phone]  and follow the prompts.  Office hours are 8:00 a.m. to 4:30 p.m. Monday - Friday. Please note that voicemails left after 4:00 p.m. may not be returned until the following business day.  We are closed weekends and major holidays. You have access to a nurse at all times for urgent questions. Please call the main number to the clinic Dept: [phone] and follow the prompts. ? ? ?For any non-urgent questions, you may also contact your provider using MyChart. We now offer e-Visits for anyone 18 and older to request care online for non-urgent symptoms. For details visit [email or web address]. ?  ?Also download the MyChart app! Go to the app store, search "MyChart", open the app, select [facility], and log in with your MyChart username and password. ? ?Due to Covid, a mask is required upon entering the hospital/clinic. If you do not have a mask, one will be given to you upon arrival. For doctor visits, patients may have 1 support person aged 18 or older with them. For treatment visits, patients cannot have anyone with them due to current Covid guidelines and our immunocompromised population.  ? ?

## 2020-07-02 ENCOUNTER — Telehealth: Payer: Self-pay | Admitting: Oncology

## 2020-07-02 ENCOUNTER — Telehealth: Payer: Self-pay | Admitting: *Deleted

## 2020-07-02 NOTE — Telephone Encounter (Signed)
This RN was informed by treatment room nurse that the patient will be out of town from 5/25-6/15.  Cycle 9 of trastuzamab is schededuled for 5/31.  Per MD- need to add to end of therapy so the pt has a total of 10 treatments.  This RN called pt to informed- obtained VM- message left.  Scheduling request sent per above.

## 2020-07-02 NOTE — Telephone Encounter (Signed)
R/s appts per 5/12 sch msg. Called pt, no answer. Left msg with updated appts date and times.

## 2020-07-06 ENCOUNTER — Telehealth: Payer: Self-pay | Admitting: Oncology

## 2020-07-06 NOTE — Telephone Encounter (Signed)
Returned pt's vm left on 5/13 about appts. No answer. Left updated vm with appts dates and times. Also let pt know to call back with any questions or concerns.

## 2020-07-21 ENCOUNTER — Ambulatory Visit: Payer: Medicare Other

## 2020-07-21 ENCOUNTER — Other Ambulatory Visit: Payer: Medicare Other

## 2020-08-07 ENCOUNTER — Encounter: Payer: Self-pay | Admitting: *Deleted

## 2020-08-10 DIAGNOSIS — Z170 Estrogen receptor positive status [ER+]: Secondary | ICD-10-CM | POA: Diagnosis not present

## 2020-08-10 DIAGNOSIS — C50412 Malignant neoplasm of upper-outer quadrant of left female breast: Secondary | ICD-10-CM | POA: Diagnosis not present

## 2020-08-10 NOTE — Progress Notes (Signed)
[facility]  Telephone:(336) [phone] Fax:(336) [phone]     ID: Holly Leon DOB: 25-Aug-1935  MR#: [id number]  IFO#:[id number]  Patient Care Team: Holly Baton, MD as PCP - General (Internal Medicine) Holly Kussmaul, MD as Consulting Physician (General Surgery) Holly Rudd, MD as Consulting Physician (Radiation Oncology) Holly Leon, Holly Dad, MD as Consulting Physician (Oncology) Holly Kaufmann, RN as Oncology Nurse Navigator Holly Germany, RN as Oncology Nurse Navigator Holly Dresser, MD as Consulting Physician (Cardiology) Holly Cruel, MD OTHER MD:  CHIEF COMPLAINT: HER-2 positive estrogen receptor positive breast cancer  CURRENT TREATMENT: Trastuzumab   INTERVAL HISTORY: Holly Leon returns today for follow up of her Her2 positive breast cancer.   She continues on trastuzumab to complete 6 months.  She is tolerating this with no side effects that she is aware of.  Her new baseline mammogram has not yet been scheduled.  Her most recent echo was 04/09/2020.   REVIEW OF SYSTEMS: Holly Leon recently returned from a 3-week trip to [place] where she is originally from.  There was a wedding and she enjoyed the whole thing quite a bit.  She exercises by walking, but is currently not doing her usual 25-minute walk around her neighborhood.  Aside from these issues a detailed review of systems today was stable   COVID 19 VACCINATION STATUS: refuses vaccination   HISTORY OF CURRENT ILLNESS: From the original intake note:  Holly Leon had routine screening mammography on 09/30/2019 showing a possible abnormality in the left breast. She underwent left diagnostic mammography with tomography and left breast ultrasonography at [facility] on 10/18/2019 showing: breast density category B; indeterminate 9 mm left breast mass at 1:30; no axillary adenopathy.  Accordingly on 10/31/2019 she proceeded to biopsy of the left breast area in question. The  pathology from this procedure ([id number]) showed: fibrocystic changes. On post-biopsy mammogram, the biopsy clip was found to have migrated approximately 3.6 cm medially. She returned for repeat biopsy on 11/11/2019, with pathology ([id number]) showing: invasive mammary carcinoma, grade 3, e-cadherin positive. Prognostic indicators significant for: estrogen receptor, 80% positive with moderate staining intensity and progesterone receptor, 0% negative. Proliferation marker Ki67 at 70%. HER2 equivocal by immunohistochemistry (2+), but positive by fluorescent in situ hybridization with a signals ratio 2.83 and number per cell 5.65.  [name]   PAST MEDICAL HISTORY: Past Medical History:  Diagnosis Date   History of left breast cancer    Hyperlipidemia    Osteoporosis    Postural dizziness with presyncope     PAST SURGICAL HISTORY: Past Surgical History:  Procedure Laterality Date   BREAST LUMPECTOMY WITH RADIOACTIVE SEED LOCALIZATION Left 01/22/2020   Procedure: LEFT BREAST LUMPECTOMY WITH RADIOACTIVE SEED LOCALIZATION;  Surgeon: Holly Kussmaul, MD;  Location: [facility];  Service: General;  Laterality: Left;   TONSILLECTOMY     TUBAL LIGATION      FAMILY HISTORY: Family History  Problem Relation Age of Onset   Colon cancer Neg Hx    Esophageal cancer Neg Hx    Rectal cancer Neg Hx    Stomach cancer Neg Hx   The patient's father died at the age of 63 from heart disease. The patient's mother died at the age of 67 from heart disease. The patient has 1 brother who died at age 100 from heart disease. There have been some cancers on both sides of the family but the patient really does not have information  regarding what type of cancers they were or when the diagnoses were made. She will try to get that information for [place]   GYNECOLOGIC HISTORY:  No LMP recorded. Patient is postmenopausal. Menarche: 85 years old Age at first live  birth: 85 years old Holly Leon P 2 LMP early 33s Contraceptive early for several years without complications HRT no Hysterectomy? no BSO? no   SOCIAL HISTORY: (updated 11/2019)  Holly Leon is widowed and lives by herself with no pets. Her husband was in the [employer]. 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 Her son Holly Leon lives in [place] and her son Holly Leon lives in [place]. They both work in [employer]. The patient has no grandchildren. She attends [employer]    ADVANCED DIRECTIVES: Not in place. At the 12/10/2019 visit the patient was given the appropriate documents to complete and notarized at her discretion. She is planning to name her son Holly Leon as her healthcare power of attorney   HEALTH MAINTENANCE: Social History   Tobacco Use   Smoking status: Never   Smokeless tobacco: Never  Vaping Use   Vaping Use: Never used  Substance Use Topics   Alcohol use: No   Drug use: No     Colonoscopy: 12/2016  PAP: none on file  Bone density: none on file   Allergies  Allergen Reactions   Lipitor [Atorvastatin] Hives    Current Outpatient Medications  Medication Sig Dispense Refill   aspirin 81 MG chewable tablet Chew by mouth daily.     calcium citrate-vitamin D (CITRACAL+D) 315-200 MG-UNIT tablet Take 1 tablet by mouth 2 (two) times daily.     clotrimazole-betamethasone (LOTRISONE) lotion Apply topically 2 (two) times daily. 90 mL 2   Multiple Vitamins-Minerals (CENTRUM ADULTS PO) Take by mouth.     simvastatin (ZOCOR) 40 MG tablet Take 40 mg by mouth daily.     vitamin B-12 (CYANOCOBALAMIN) 500 MCG tablet Take 500 mcg by mouth daily.     vitamin C (ASCORBIC ACID) 500 MG tablet Take 500 mg by mouth daily.     vitamin E 400 UNIT capsule Take 400 Units by mouth daily.     No current facility-administered medications for this visit.    OBJECTIVE: White woman who appears younger than stated  Vitals:    08/11/20 1104  BP: (!) 157/72  Pulse: 81  Resp: 18  Temp: (!) 97.3 F (36.3 C)  SpO2: 98%      Body mass index is 25.56 kg/m.   Wt Readings from Last 3 Encounters:  08/11/20 135 lb 4.8 oz (61.4 kg)  06/30/20 138 lb 8 oz (62.8 kg)  06/09/20 140 lb 12 oz (63.8 kg)      ECOG FS:1 - Symptomatic but completely ambulatory  Sclerae unicteric, EOMs intact Wearing a mask No cervical or supraclavicular adenopathy Lungs no rales or rhonchi Heart regular rate and rhythm Abd soft, nontender, positive bowel sounds MSK no focal spinal tenderness, no upper extremity lymphedema Neuro: nonfocal, well oriented, appropriate affect Breasts: The right breast is benign.  The left breast has undergone lumpectomy and radiation.  There is no evidence of local recurrence.  Both axillae are benign   LAB RESULTS:  CMP     Component Value Date/Time   NA 139 06/30/2020 0809   K 4.3 06/30/2020 0809   CL 106 06/30/2020 0809   CO2 24 06/30/2020 0809   GLUCOSE 107 (H) 06/30/2020 0809  BUN 12 06/30/2020 0809   CREATININE 0.96 06/30/2020 0809   CALCIUM 9.4 06/30/2020 0809   PROT 7.8 06/30/2020 0809   ALBUMIN 3.7 06/30/2020 0809   AST 18 06/30/2020 0809   ALT 14 06/30/2020 0809   ALKPHOS 85 06/30/2020 0809   BILITOT 0.5 06/30/2020 0809   GFRNONAA 59 (L) 06/30/2020 0809   GFRAA  03/24/2008 1642    >60        The eGFR has been calculated using the MDRD equation. This calculation has not been validated in all clinical situations. eGFR's persistently <60 mL/min signify possible Chronic Kidney Disease.    No results found for: TOTALPROTELP, ALBUMINELP, A1GS, A2GS, BETS, BETA2SER, GAMS, MSPIKE, SPEI  Lab Results  Component Value Date   WBC 4.7 08/11/2020   NEUTROABS 2.9 08/11/2020   HGB 13.2 08/11/2020   HCT 39.6 08/11/2020   MCV 88.0 08/11/2020   PLT 348 08/11/2020    No results found for: LABCA2  No components found for: RJJOAC166  No results for input(s): INR in the last 168  hours.  No results found for: LABCA2  No results found for: AYT016  No results found for: WFU932  No results found for: TFT732  No results found for: CA2729  No components found for: HGQUANT  No results found for: CEA1 / No results found for: CEA1   No results found for: AFPTUMOR  No results found for: CHROMOGRNA  No results found for: KPAFRELGTCHN, LAMBDASER, KAPLAMBRATIO (kappa/lambda light chains)  No results found for: HGBA, HGBA2QUANT, HGBFQUANT, HGBSQUAN (Hemoglobinopathy evaluation)   No results found for: LDH  No results found for: IRON, TIBC, IRONPCTSAT (Iron and TIBC)  No results found for: FERRITIN  Urinalysis No results found for: COLORURINE, APPEARANCEUR, LABSPEC, PHURINE, GLUCOSEU, HGBUR, BILIRUBINUR, KETONESUR, PROTEINUR, UROBILINOGEN, NITRITE, LEUKOCYTESUR   STUDIES: No results found.   ELIGIBLE FOR AVAILABLE RESEARCH PROTOCOL: no   ASSESSMENT: 85 y.o. [place] woman status post left breast upper outer quadrant biopsy 11/11/2019 for a clinical T1b N0, stage IA invasive ductal carcinoma, grade 3, estrogen receptor positive, progesterone receptor negative, HER-2 amplified, with an MIB-1 of 70%  (1) s/p left lumpectomy w/o sentinel node sampling 01/22/2020 showed a pT1b cN0, stage IB invasive ductal carcinoma, grade 3, with negative margins  (2) anti-HER-2 immunotherapy starting 02/04/2020, to continue every 21 days for 6 months  (a) echo 01/02/2020 shows an EF of 70-75%  (B) echo 04/09/2020 shows an ejection fraction in the 65-70% range  (3) adjuvant radiation 03/10/2020 through 04/06/2020 Site Technique Total Dose (Gy) Dose per Fx (Gy) Completed Fx Beam Energies  Breast, Left: Breast_Lt 3D 42.56/42.56 2.66 16/16 6X  Breast, Left: Breast_Lt_Bst specialPort 8/8 2 4/4 12E, 15E   (4) antiestrogens discussed 04/07/2020, patient opted against   PLAN:  Allsion is tolerating the trastuzumab without any unusual side effects.  She is a bit behind on  her echoes and I have entered for her to have an echo before her last treatment 09/01/2020.  She needs new baseline mammogram and we have scheduled that for early September.  I have encouraged her to resume her walking program.  She continues to travel and is normally very active in any case.  Given her excellent prognosis even though we only have limited data for 6 months of trastuzumab that is the plan so on 09/01/2020 she will complete her treatments.  She knows to call for any other issue that may develop before the next visit  Total encounter time 25 minutes.Sarajane Jews C.  Donja Tipping, MD 08/11/2020 11:22 AM Medical Oncology and Hematology [facility] [place], [place] [address] Tel. [phone]    Fax. [phone]   This document serves as a record of services personally performed by Lurline Del, MD. It was created on his behalf by Wilburn Mylar, a trained medical scribe. The creation of this record is based on the scribe's personal observations and the provider's statements to them.   I, Lurline Del MD, have reviewed the above documentation for accuracy and completeness, and I agree with the above.   *Total Encounter Time as defined by the Centers for Medicare and Medicaid Services includes, in addition to the face-to-face time of a patient visit (documented in the note above) non-face-to-face time: obtaining and reviewing outside history, ordering and reviewing medications, tests or procedures, care coordination (communications with other health care professionals or caregivers) and documentation in the medical record.

## 2020-08-11 ENCOUNTER — Other Ambulatory Visit: Payer: Self-pay

## 2020-08-11 ENCOUNTER — Inpatient Hospital Stay: Payer: Medicare Other | Attending: Oncology

## 2020-08-11 ENCOUNTER — Inpatient Hospital Stay (HOSPITAL_BASED_OUTPATIENT_CLINIC_OR_DEPARTMENT_OTHER): Payer: Medicare Other | Admitting: Oncology

## 2020-08-11 ENCOUNTER — Inpatient Hospital Stay: Payer: Medicare Other

## 2020-08-11 VITALS — BP 157/72 | HR 81 | Temp 97.3°F | Resp 18 | Ht 61.0 in | Wt 135.3 lb

## 2020-08-11 DIAGNOSIS — Z8249 Family history of ischemic heart disease and other diseases of the circulatory system: Secondary | ICD-10-CM | POA: Insufficient documentation

## 2020-08-11 DIAGNOSIS — C50412 Malignant neoplasm of upper-outer quadrant of left female breast: Secondary | ICD-10-CM

## 2020-08-11 DIAGNOSIS — Z7982 Long term (current) use of aspirin: Secondary | ICD-10-CM | POA: Diagnosis not present

## 2020-08-11 DIAGNOSIS — Z5112 Encounter for antineoplastic immunotherapy: Secondary | ICD-10-CM | POA: Insufficient documentation

## 2020-08-11 DIAGNOSIS — E785 Hyperlipidemia, unspecified: Secondary | ICD-10-CM | POA: Diagnosis not present

## 2020-08-11 DIAGNOSIS — Z170 Estrogen receptor positive status [ER+]: Secondary | ICD-10-CM | POA: Insufficient documentation

## 2020-08-11 DIAGNOSIS — Z79899 Other long term (current) drug therapy: Secondary | ICD-10-CM | POA: Insufficient documentation

## 2020-08-11 DIAGNOSIS — I5083 High output heart failure: Secondary | ICD-10-CM

## 2020-08-11 LAB — CBC WITH DIFFERENTIAL (CANCER CENTER ONLY)
Abs Immature Granulocytes: 0.01 10*3/uL (ref 0.00–0.07)
Basophils Absolute: 0 10*3/uL (ref 0.0–0.1)
Basophils Relative: 1 %
Eosinophils Absolute: 0.2 10*3/uL (ref 0.0–0.5)
Eosinophils Relative: 5 %
HCT: 39.6 % (ref 36.0–46.0)
Hemoglobin: 13.2 g/dL (ref 12.0–15.0)
Immature Granulocytes: 0 %
Lymphocytes Relative: 22 %
Lymphs Abs: 1 10*3/uL (ref 0.7–4.0)
MCH: 29.3 pg (ref 26.0–34.0)
MCHC: 33.3 g/dL (ref 30.0–36.0)
MCV: 88 fL (ref 80.0–100.0)
Monocytes Absolute: 0.5 10*3/uL (ref 0.1–1.0)
Monocytes Relative: 11 %
Neutro Abs: 2.9 10*3/uL (ref 1.7–7.7)
Neutrophils Relative %: 61 %
Platelet Count: 348 10*3/uL (ref 150–400)
RBC: 4.5 MIL/uL (ref 3.87–5.11)
RDW: 13.5 % (ref 11.5–15.5)
WBC Count: 4.7 10*3/uL (ref 4.0–10.5)
nRBC: 0 % (ref 0.0–0.2)

## 2020-08-11 LAB — CMP (CANCER CENTER ONLY)
ALT: 25 U/L (ref 0–44)
AST: 21 U/L (ref 15–41)
Albumin: 3.9 g/dL (ref 3.5–5.0)
Alkaline Phosphatase: 76 U/L (ref 38–126)
Anion gap: 7 (ref 5–15)
BUN: 15 mg/dL (ref 8–23)
CO2: 24 mmol/L (ref 22–32)
Calcium: 9.5 mg/dL (ref 8.9–10.3)
Chloride: 109 mmol/L (ref 98–111)
Creatinine: 0.86 mg/dL (ref 0.44–1.00)
GFR, Estimated: >60 mL/min (ref >60)
Glucose, Bld: 103 mg/dL — ABNORMAL HIGH (ref 70–99)
Potassium: 4.1 mmol/L (ref 3.5–5.1)
Sodium: 140 mmol/L (ref 135–145)
Total Bilirubin: 0.7 mg/dL (ref 0.3–1.2)
Total Protein: 8.1 g/dL (ref 6.5–8.1)

## 2020-08-11 MED ORDER — ACETAMINOPHEN 325 MG PO TABS
650.0000 mg | ORAL_TABLET | Freq: Once | ORAL | Status: AC
  Administered 2020-08-11: 650 mg via ORAL

## 2020-08-11 MED ORDER — SODIUM CHLORIDE 0.9 % IV SOLN
Freq: Once | INTRAVENOUS | Status: AC
  Filled 2020-08-11: qty 250

## 2020-08-11 MED ORDER — DIPHENHYDRAMINE HCL 25 MG PO CAPS
25.0000 mg | ORAL_CAPSULE | Freq: Once | ORAL | Status: AC
  Administered 2020-08-11: 25 mg via ORAL

## 2020-08-11 MED ORDER — DIPHENHYDRAMINE HCL 25 MG PO CAPS
ORAL_CAPSULE | ORAL | Status: AC
  Filled 2020-08-11: qty 1

## 2020-08-11 MED ORDER — TRASTUZUMAB-DKST CHEMO 150 MG IV SOLR
6.0000 mg/kg | Freq: Once | INTRAVENOUS | Status: AC
  Administered 2020-08-11: 378 mg via INTRAVENOUS
  Filled 2020-08-11: qty 18

## 2020-08-11 MED ORDER — ACETAMINOPHEN 325 MG PO TABS
ORAL_TABLET | ORAL | Status: AC
  Filled 2020-08-11: qty 2

## 2020-08-11 NOTE — Progress Notes (Signed)
No reload of trastuzumab necessary today per Dr. Jana Hakim.  Kennith Center, Pharm.D., CPP 08/11/2020@11 :58 AM

## 2020-08-11 NOTE — Patient Instructions (Signed)
[facility]  Discharge Instructions: ?Thank you for choosing [facility] to provide your oncology and hematology care.  ? ?If you have a lab appointment with the Cancer Center, please go directly to the Cancer Center and check in at the registration area. ?  ?Wear comfortable clothing and clothing appropriate for easy access to any Portacath or PICC line.  ? ?We strive to give you quality time with your provider. You may need to reschedule your appointment if you arrive late (15 or more minutes).  Arriving late affects you and other patients whose appointments are after yours.  Also, if you miss three or more appointments without notifying the office, you may be dismissed from the clinic at the provider?s discretion.    ?  ?For prescription refill requests, have your pharmacy contact our office and allow 72 hours for refills to be completed.   ? ?Today you received the following chemotherapy and/or immunotherapy agents: Trastuzumab    ?  ?To help prevent nausea and vomiting after your treatment, we encourage you to take your nausea medication as directed. ? ?BELOW ARE SYMPTOMS THAT SHOULD BE REPORTED IMMEDIATELY: ?*FEVER GREATER THAN 100.4 F (38 ?C) OR HIGHER ?*CHILLS OR SWEATING ?*NAUSEA AND VOMITING THAT IS NOT CONTROLLED WITH YOUR NAUSEA MEDICATION ?*UNUSUAL SHORTNESS OF BREATH ?*UNUSUAL BRUISING OR BLEEDING ?*URINARY PROBLEMS (pain or burning when urinating, or frequent urination) ?*BOWEL PROBLEMS (unusual diarrhea, constipation, pain near the anus) ?TENDERNESS IN MOUTH AND THROAT WITH OR WITHOUT PRESENCE OF ULCERS (sore throat, sores in mouth, or a toothache) ?UNUSUAL RASH, SWELLING OR PAIN  ?UNUSUAL VAGINAL DISCHARGE OR ITCHING  ? ?Items with * indicate a potential emergency and should be followed up as soon as possible or go to the Emergency Department if any problems should occur. ? ?Please show the CHEMOTHERAPY ALERT CARD or IMMUNOTHERAPY ALERT CARD at check-in  to the Emergency Department and triage nurse. ? ?Should you have questions after your visit or need to cancel or reschedule your appointment, please contact [facility]  Dept: [phone]  and follow the prompts.  Office hours are 8:00 a.m. to 4:30 p.m. Monday - Friday. Please note that voicemails left after 4:00 p.m. may not be returned until the following business day.  We are closed weekends and major holidays. You have access to a nurse at all times for urgent questions. Please call the main number to the clinic Dept: [phone] and follow the prompts. ? ? ?For any non-urgent questions, you may also contact your provider using MyChart. We now offer e-Visits for anyone 18 and older to request care online for non-urgent symptoms. For details visit [email or web address]. ?  ?Also download the MyChart app! Go to the app store, search "MyChart", open the app, select [facility], and log in with your MyChart username and password. ? ?Due to Covid, a mask is required upon entering the hospital/clinic. If you do not have a mask, one will be given to you upon arrival. For doctor visits, patients may have 1 support person aged 18 or older with them. For treatment visits, patients cannot have anyone with them due to current Covid guidelines and our immunocompromised population.  ? ?

## 2020-08-11 NOTE — Progress Notes (Signed)
Ok to treat with echo from 2/22 per office note.  Pt scheduled for new echo 08/27/20

## 2020-08-26 ENCOUNTER — Encounter: Payer: Self-pay | Admitting: Oncology

## 2020-08-27 ENCOUNTER — Other Ambulatory Visit: Payer: Self-pay

## 2020-08-27 ENCOUNTER — Ambulatory Visit (HOSPITAL_COMMUNITY): Payer: Medicare Other | Attending: Cardiovascular Disease

## 2020-08-27 DIAGNOSIS — I5083 High output heart failure: Secondary | ICD-10-CM | POA: Insufficient documentation

## 2020-08-27 DIAGNOSIS — C50412 Malignant neoplasm of upper-outer quadrant of left female breast: Secondary | ICD-10-CM | POA: Diagnosis not present

## 2020-08-27 DIAGNOSIS — Z170 Estrogen receptor positive status [ER+]: Secondary | ICD-10-CM | POA: Insufficient documentation

## 2020-08-27 LAB — ECHOCARDIOGRAM COMPLETE
AR max vel: 1.86 cm2
AV Area VTI: 1.69 cm2
AV Area mean vel: 1.73 cm2
AV Mean grad: 5.3 mmHg
AV Peak grad: 8.8 mmHg
Ao pk vel: 1.49 m/s
Area-P 1/2: 3.85 cm2
S' Lateral: 1.6 cm

## 2020-08-28 ENCOUNTER — Encounter: Payer: Self-pay | Admitting: Oncology

## 2020-08-31 NOTE — Progress Notes (Signed)
Holly Leon  [phone] [phone]) [phone]     ID: Holly Leon DOB: [birth year]  MR#: [id number]  SWN#:[id number]  Patient Care Team: Shon Baton, MD as PCP - General (Internal Medicine) Jovita Kussmaul, MD as Consulting Physician (General Surgery) Kyung Rudd, MD as Consulting Physician (Radiation Oncology) Leon, Holly Dad, MD as Consulting Physician (Oncology) Mauro Kaufmann, RN as Oncology Nurse Navigator Rockwell Germany, RN as Oncology Nurse Navigator Larey Dresser, MD as Consulting Physician (Cardiology) Chauncey Cruel, MD OTHER MD:  CHIEF COMPLAINT: HER-2 positive estrogen receptor positive breast cancer  CURRENT TREATMENT: Trastuzumab   INTERVAL HISTORY: Holly Leon returns today for follow up of her Her2 positive breast cancer.   She continues on trastuzumab completing 6 months today. she is tolerating this with no side effects that she is aware of.  Since her last visit, she underwent repeat echocardiogram on 08/27/2020 showing an ejection fraction of >75%.  Her new baseline mammogram has not yet been scheduled.   REVIEW OF SYSTEMS: Holly Leon is delighted to be done with the Herceptin and with the fact that her echocardiogram was very favorable.  She would like her port removed as soon as practicable.  A detailed review of systems today was otherwise stable.   COVID 19 VACCINATION STATUS: refuses vaccination   HISTORY OF CURRENT ILLNESS: From the original intake note:  Holly Leon had routine screening mammography on 09/30/2019 showing a possible abnormality in the left breast. She underwent left diagnostic mammography with tomography and left breast ultrasonography at [facility] on 10/18/2019 showing: breast density category B; indeterminate 9 mm left breast mass at 1:30; no axillary adenopathy.  Accordingly on 10/31/2019 she proceeded to biopsy of the left breast area in question. The pathology from this procedure  ([id number]) showed: fibrocystic changes. On post-biopsy mammogram, the biopsy clip was found to have migrated approximately 3.6 cm medially. She returned for repeat biopsy on 11/11/2019, with pathology ([id number]) showing: invasive mammary carcinoma, grade 3, e-cadherin positive. Prognostic indicators significant for: estrogen receptor, 80% positive with moderate staining intensity and progesterone receptor, 0% negative. Proliferation marker Ki67 at 70%. HER2 equivocal by immunohistochemistry (2+), but positive by fluorescent in situ hybridization with a signals ratio 2.83 and number per cell 5.65.  [name]   PAST MEDICAL HISTORY: Past Medical History:  Diagnosis Date   History of left breast cancer    Hyperlipidemia    Osteoporosis    Postural dizziness with presyncope     PAST SURGICAL HISTORY: Past Surgical History:  Procedure Laterality Date   BREAST LUMPECTOMY WITH RADIOACTIVE SEED LOCALIZATION Left 01/22/2020   Procedure: LEFT BREAST LUMPECTOMY WITH RADIOACTIVE SEED LOCALIZATION;  Surgeon: Jovita Kussmaul, MD;  Location: [facility];  Service: General;  Laterality: Left;   TONSILLECTOMY     TUBAL LIGATION      FAMILY HISTORY: Family History  Problem Relation Age of Onset   Colon cancer Neg Hx    Esophageal cancer Neg Hx    Rectal cancer Neg Hx    Stomach cancer Neg Hx   The patient's father died at the age of 51 from heart disease. The patient's mother died at the age of 77 from heart disease. The patient has 1 brother who died at age 70 from heart disease. There have been some cancers on both sides of the family but the patient really does not have information regarding what type of cancers they were or when the  diagnoses were made. She will try to get that information for [place]   GYNECOLOGIC HISTORY:  No LMP recorded. Patient is postmenopausal. Menarche: 85 years old Age at first live birth: 85 years old [place] P 2 LMP  early 11s Contraceptive early for several years without complications HRT no Hysterectomy? no BSO? no   SOCIAL HISTORY: (updated 11/2019)  Holly Leon is widowed and lives by herself with no pets. Her husband was in the [employer]. 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 Her son [name] lives in [place] and her son [name] lives in [place]. They both work in [employer]. The patient has no grandchildren. She attends [employer]    ADVANCED DIRECTIVES: Not in place. At the 12/10/2019 visit the patient was given the appropriate documents to complete and notarized at her discretion. She is planning to name her son [name] as her healthcare power of attorney   HEALTH MAINTENANCE: Social History   Tobacco Use   Smoking status: Never   Smokeless tobacco: Never  Vaping Use   Vaping Use: Never used  Substance Use Topics   Alcohol use: No   Drug use: No     Colonoscopy: 12/2016  PAP: none on file  Bone density: none on file   Allergies  Allergen Reactions   Lipitor [Atorvastatin] Hives    Current Outpatient Medications  Medication Sig Dispense Refill   aspirin 81 MG chewable tablet Chew by mouth daily.     calcium citrate-vitamin D (CITRACAL+D) 315-200 MG-UNIT tablet Take 1 tablet by mouth 2 (two) times daily.     clotrimazole-betamethasone (LOTRISONE) lotion Apply topically 2 (two) times daily. 90 mL 2   Multiple Vitamins-Minerals (CENTRUM ADULTS PO) Take by mouth.     simvastatin (ZOCOR) 40 MG tablet Take 40 mg by mouth daily.     vitamin B-12 (CYANOCOBALAMIN) 500 MCG tablet Take 500 mcg by mouth daily.     vitamin C (ASCORBIC ACID) 500 MG tablet Take 500 mg by mouth daily.     vitamin E 400 UNIT capsule Take 400 Units by mouth daily.     No current facility-administered medications for this visit.    OBJECTIVE: White woman examined in the infusion area  There were no vitals filed for this  visit.  For vitals associated with the 09/01/2020 visit please see the infusion area flowsheet     There is no height or weight on file to calculate BMI.   Wt Readings from Last 3 Encounters:  09/01/20 135 lb 4 oz (61.3 kg)  08/11/20 135 lb 4.8 oz (61.4 kg)  06/30/20 138 lb 8 oz (62.8 kg)      ECOG FS:1 - Symptomatic but completely ambulatory  Lungs no rales or rhonchi Heart regular rate and rhythm Abd soft, nontender, positive bowel sounds Neuro: nonfocal, well oriented, appropriate affect Breasts: Deferred   LAB RESULTS:  CMP     Component Value Date/Time   NA 140 09/01/2020 0907   K 4.0 09/01/2020 0907   CL 106 09/01/2020 0907   CO2 23 09/01/2020 0907   GLUCOSE 99 09/01/2020 0907   BUN 11 09/01/2020 0907   CREATININE 0.95 09/01/2020 0907   CALCIUM 9.7 09/01/2020 0907   PROT 8.1 09/01/2020 0907   ALBUMIN 3.7 09/01/2020 0907   AST 23 09/01/2020 0907   ALT 22 09/01/2020 0907   ALKPHOS 86 09/01/2020 0907   BILITOT 0.7 09/01/2020 0907   GFRNONAA 60 (L)  09/01/2020 0907   GFRAA  03/24/2008 1642    >60        The eGFR has been calculated using the MDRD equation. This calculation has not been validated in all clinical situations. eGFR's persistently <60 mL/min signify possible Chronic Kidney Disease.    No results found for: TOTALPROTELP, ALBUMINELP, A1GS, A2GS, BETS, BETA2SER, GAMS, MSPIKE, SPEI  Lab Results  Component Value Date   WBC 4.5 09/01/2020   NEUTROABS 3.1 09/01/2020   HGB 13.4 09/01/2020   HCT 39.3 09/01/2020   MCV 86.9 09/01/2020   PLT 268 09/01/2020    No results found for: LABCA2  No components found for: SAYTKZ601  No results for input(s): INR in the last 168 hours.  No results found for: LABCA2  No results found for: UXN235  No results found for: TDD220  No results found for: URK270  No results found for: CA2729  No components found for: HGQUANT  No results found for: CEA1 / No results found for: CEA1   No results found  for: AFPTUMOR  No results found for: CHROMOGRNA  No results found for: KPAFRELGTCHN, LAMBDASER, KAPLAMBRATIO (kappa/lambda light chains)  No results found for: HGBA, HGBA2QUANT, HGBFQUANT, HGBSQUAN (Hemoglobinopathy evaluation)   No results found for: LDH  No results found for: IRON, TIBC, IRONPCTSAT (Iron and TIBC)  No results found for: FERRITIN  Urinalysis No results found for: COLORURINE, APPEARANCEUR, LABSPEC, PHURINE, GLUCOSEU, HGBUR, BILIRUBINUR, KETONESUR, PROTEINUR, UROBILINOGEN, NITRITE, LEUKOCYTESUR   STUDIES: ECHOCARDIOGRAM COMPLETE  Result Date: 08/27/2020    ECHOCARDIOGRAM REPORT   Patient Name:   Holly Leon Date of Exam: 08/27/2020 Medical Rec #:  [id number]          Height:       61.0 in Accession #:    [id number]         Weight:       135.3 lb Date of Birth:  [birth year]         BSA:          1.600 m Patient Age:    13 years           BP:           157/72 mmHg Patient Gender: F                  HR:           97 bpm. Exam Location:  [facility] Procedure: 2D Echo, Cardiac Doppler and Color Doppler Indications:    Z09 Chemotherapy  History:        Patient has prior history of Echocardiogram examinations, most                 recent 04/19/2020. Malignant neoplasm of upper-outer quadrant of                 left breast in female, estrogen receptor positive.  Sonographer:    Diamond Nickel RCS Referring Phys: [place]  1. Hyperdynamic LV function with intracavitary gradient up to 17 mmHG. Left ventricular ejection fraction, by estimation, is >75%. The left ventricle has hyperdynamic function. The left ventricle has no regional wall motion abnormalities. Left ventricular diastolic parameters are consistent with Grade I diastolic dysfunction (impaired relaxation).  2. Right ventricular systolic function is normal. The right ventricular size is normal. There is normal pulmonary artery systolic pressure. The estimated right ventricular systolic pressure  is 10.6 mmHg.  3. The mitral valve is grossly normal. Trivial mitral valve regurgitation.  No evidence of mitral stenosis.  4. The aortic valve is calcified. Aortic valve regurgitation is not visualized. Mild to moderate aortic valve sclerosis/calcification is present, without any evidence of aortic stenosis.  5. The inferior vena cava is normal in size with greater than 50% respiratory variability, suggesting right atrial pressure of 3 mmHg. Comparison(s): No significant change from prior study. FINDINGS  Left Ventricle: Hyperdynamic LV function with intracavitary gradient up to 17 mmHG. Left ventricular ejection fraction, by estimation, is >75%. The left ventricle has hyperdynamic function. The left ventricle has no regional wall motion abnormalities. Global longitudinal strain performed but not reported based on interpreter judgement due to suboptimal tracking. The left ventricular internal cavity size was normal in size. There is no left ventricular hypertrophy. Left ventricular diastolic parameters  are consistent with Grade I diastolic dysfunction (impaired relaxation). Right Ventricle: The right ventricular size is normal. No increase in right ventricular wall thickness. Right ventricular systolic function is normal. There is normal pulmonary artery systolic pressure. The tricuspid regurgitant velocity is 2.70 m/s, and  with an assumed right atrial pressure of 3 mmHg, the estimated right ventricular systolic pressure is 85.6 mmHg. Left Atrium: Left atrial size was normal in size. Right Atrium: Right atrial size was normal in size. Pericardium: There is no evidence of pericardial effusion. Presence of pericardial fat pad. Mitral Valve: The mitral valve is grossly normal. Mild mitral annular calcification. Trivial mitral valve regurgitation. No evidence of mitral valve stenosis. Tricuspid Valve: The tricuspid valve is grossly normal. Tricuspid valve regurgitation is trivial. No evidence of tricuspid stenosis.  Aortic Valve: The aortic valve is calcified. Aortic valve regurgitation is not visualized. Mild to moderate aortic valve sclerosis/calcification is present, without any evidence of aortic stenosis. Aortic valve mean gradient measures 5.3 mmHg. Aortic valve peak gradient measures 8.8 mmHg. Aortic valve area, by VTI measures 1.69 cm. Pulmonic Valve: The pulmonic valve was grossly normal. Pulmonic valve regurgitation is not visualized. No evidence of pulmonic stenosis. Aorta: The aortic root and ascending aorta are structurally normal, with no evidence of dilitation. Venous: The inferior vena cava is normal in size with greater than 50% respiratory variability, suggesting right atrial pressure of 3 mmHg. IAS/Shunts: The atrial septum is grossly normal.  LEFT VENTRICLE PLAX 2D LVIDd:         3.60 cm  Diastology LVIDs:         1.60 cm  LV e' medial:    6.53 cm/s LV PW:         0.90 cm  LV E/e' medial:  11.6 LV IVS:        0.80 cm  LV e' lateral:   8.05 cm/s LVOT diam:     1.65 cm  LV E/e' lateral: 9.4 LV SV:         52 LV SV Index:   32 LVOT Area:     2.14 cm  RIGHT VENTRICLE RV Basal diam:  2.00 cm RV S prime:     16.30 cm/s TAPSE (M-mode): 2.3 cm RVSP:           32.2 mmHg LEFT ATRIUM             Index       RIGHT ATRIUM           Index LA diam:        2.60 cm 1.63 cm/m  RA Pressure: 3.00 mmHg LA Vol (A2C):   32.8 ml 20.50 ml/m RA Area:     11.00 cm  LA Vol (A4C):   25.6 ml 16.00 ml/m RA Volume:   19.00 ml  11.88 ml/m LA Biplane Vol: 32.1 ml 20.07 ml/m  AORTIC VALVE AV Area (Vmax):    1.86 cm AV Area (Vmean):   1.73 cm AV Area (VTI):     1.69 cm AV Vmax:           148.63 cm/s AV Vmean:          110.445 cm/s AV VTI:            0.307 m AV Peak Grad:      8.8 mmHg AV Mean Grad:      5.3 mmHg LVOT Vmax:         129.00 cm/s LVOT Vmean:        89.200 cm/s LVOT VTI:          0.243 m LVOT/AV VTI ratio: 0.79  AORTA Ao Root diam: 2.70 cm Ao Asc diam:  3.10 cm MITRAL VALVE                TRICUSPID VALVE MV Area (PHT):  3.85 cm     TR Peak grad:   29.2 mmHg MV Decel Time: 197 msec     TR Vmax:        270.00 cm/s MV E velocity: 76.00 cm/s   Estimated RAP:  3.00 mmHg MV A velocity: 123.00 cm/s  RVSP:           32.2 mmHg MV E/A ratio:  0.62                             SHUNTS                             Systemic VTI:  0.24 m                             Systemic Diam: 1.65 cm Holly Chiquito MD Electronically signed by Holly Chiquito MD Signature Date/Time: 08/27/2020/4:53:15 PM    Final      ELIGIBLE FOR AVAILABLE RESEARCH PROTOCOL: no   ASSESSMENT: 85 y.o. Holly Leon woman status post left breast upper outer quadrant biopsy 11/11/2019 for a clinical T1b N0, stage IA invasive ductal carcinoma, grade 3, estrogen receptor positive, progesterone receptor negative, HER-2 amplified, with an MIB-1 of 70%  (1) s/p left lumpectomy w/o sentinel node sampling 01/22/2020 showed a pT1b cN0, stage IB invasive ductal carcinoma, grade 3, with negative margins  (2) anti-HER-2 immunotherapy starting 02/04/2020, completing 6 months 09/01/2020  (a) echo 01/02/2020 shows an EF of 70-75%  (B) echo 04/09/2020 shows an ejection fraction in the 65-70% range  (C) echo 08/27/2020 shows a hyperdynamic LV with an ejection fraction greater than 75%  (3) adjuvant radiation 03/10/2020 through 04/06/2020 Site Technique Total Dose (Gy) Dose per Fx (Gy) Completed Fx Beam Energies  Breast, Left: Breast_Lt 3D 42.56/42.56 2.66 16/16 6X  Breast, Left: Breast_Lt_Bst specialPort 8/8 2 4/4 12E, 15E   (4) antiestrogens discussed 04/07/2020, patient opted against   PLAN:  Keilee completes her anti-HER2 immunotherapy today.  She just had an echo which is very favorable.    She is now ready to have her port removed.  I have alerted her surgeon.  We are setting her up for a new baseline mammography in September and she will return to see me in October.  At that point we will broaden the follow-up interval  Total encounter time 20 minutes.Holly Jews C.  Magrinat, MD 09/01/2020 6:05 PM Medical Oncology and Hematology [facility] [place], [place] [address] Tel. [phone]    Fax. [phone]   This document serves as a record of services personally performed by Lurline Del, MD. It was created on his behalf by Wilburn Mylar, a trained medical scribe. The creation of this record is based on the scribe's personal observations and the provider's statements to them.   I, Lurline Del MD, have reviewed the above documentation for accuracy and completeness, and I agree with the above.   *Total Encounter Time as defined by the Centers for Medicare and Medicaid Services includes, in addition to the face-to-face time of a patient visit (documented in the note above) non-face-to-face time: obtaining and reviewing outside history, ordering and reviewing medications, tests or procedures, care coordination (communications with other health care professionals or caregivers) and documentation in the medical record.

## 2020-09-01 ENCOUNTER — Inpatient Hospital Stay: Payer: Medicare Other

## 2020-09-01 ENCOUNTER — Inpatient Hospital Stay: Payer: Medicare Other | Attending: Oncology

## 2020-09-01 ENCOUNTER — Other Ambulatory Visit: Payer: Self-pay

## 2020-09-01 ENCOUNTER — Inpatient Hospital Stay (HOSPITAL_BASED_OUTPATIENT_CLINIC_OR_DEPARTMENT_OTHER): Payer: Medicare Other | Admitting: Oncology

## 2020-09-01 VITALS — BP 147/70 | HR 85 | Temp 97.7°F | Resp 18 | Wt 135.2 lb

## 2020-09-01 DIAGNOSIS — Z5112 Encounter for antineoplastic immunotherapy: Secondary | ICD-10-CM | POA: Diagnosis not present

## 2020-09-01 DIAGNOSIS — Z79899 Other long term (current) drug therapy: Secondary | ICD-10-CM | POA: Diagnosis not present

## 2020-09-01 DIAGNOSIS — C50412 Malignant neoplasm of upper-outer quadrant of left female breast: Secondary | ICD-10-CM

## 2020-09-01 DIAGNOSIS — E785 Hyperlipidemia, unspecified: Secondary | ICD-10-CM | POA: Insufficient documentation

## 2020-09-01 DIAGNOSIS — Z7982 Long term (current) use of aspirin: Secondary | ICD-10-CM | POA: Insufficient documentation

## 2020-09-01 DIAGNOSIS — Z170 Estrogen receptor positive status [ER+]: Secondary | ICD-10-CM | POA: Diagnosis not present

## 2020-09-01 DIAGNOSIS — Z923 Personal history of irradiation: Secondary | ICD-10-CM | POA: Insufficient documentation

## 2020-09-01 LAB — CMP (CANCER CENTER ONLY)
ALT: 22 U/L (ref 0–44)
AST: 23 U/L (ref 15–41)
Albumin: 3.7 g/dL (ref 3.5–5.0)
Alkaline Phosphatase: 86 U/L (ref 38–126)
Anion gap: 11 (ref 5–15)
BUN: 11 mg/dL (ref 8–23)
CO2: 23 mmol/L (ref 22–32)
Calcium: 9.7 mg/dL (ref 8.9–10.3)
Chloride: 106 mmol/L (ref 98–111)
Creatinine: 0.95 mg/dL (ref 0.44–1.00)
GFR, Estimated: 60 mL/min — ABNORMAL LOW (ref >60)
Glucose, Bld: 99 mg/dL (ref 70–99)
Potassium: 4 mmol/L (ref 3.5–5.1)
Sodium: 140 mmol/L (ref 135–145)
Total Bilirubin: 0.7 mg/dL (ref 0.3–1.2)
Total Protein: 8.1 g/dL (ref 6.5–8.1)

## 2020-09-01 LAB — CBC WITH DIFFERENTIAL (CANCER CENTER ONLY)
Abs Immature Granulocytes: 0.01 10*3/uL (ref 0.00–0.07)
Basophils Absolute: 0 10*3/uL (ref 0.0–0.1)
Basophils Relative: 1 %
Eosinophils Absolute: 0.2 10*3/uL (ref 0.0–0.5)
Eosinophils Relative: 4 %
HCT: 39.3 % (ref 36.0–46.0)
Hemoglobin: 13.4 g/dL (ref 12.0–15.0)
Immature Granulocytes: 0 %
Lymphocytes Relative: 17 %
Lymphs Abs: 0.8 10*3/uL (ref 0.7–4.0)
MCH: 29.6 pg (ref 26.0–34.0)
MCHC: 34.1 g/dL (ref 30.0–36.0)
MCV: 86.9 fL (ref 80.0–100.0)
Monocytes Absolute: 0.4 10*3/uL (ref 0.1–1.0)
Monocytes Relative: 10 %
Neutro Abs: 3.1 10*3/uL (ref 1.7–7.7)
Neutrophils Relative %: 68 %
Platelet Count: 268 10*3/uL (ref 150–400)
RBC: 4.52 MIL/uL (ref 3.87–5.11)
RDW: 13.9 % (ref 11.5–15.5)
WBC Count: 4.5 10*3/uL (ref 4.0–10.5)
nRBC: 0 % (ref 0.0–0.2)

## 2020-09-01 MED ORDER — ACETAMINOPHEN 325 MG PO TABS
650.0000 mg | ORAL_TABLET | Freq: Once | ORAL | Status: AC
  Administered 2020-09-01: 650 mg via ORAL

## 2020-09-01 MED ORDER — SODIUM CHLORIDE 0.9 % IV SOLN
Freq: Once | INTRAVENOUS | Status: AC
  Filled 2020-09-01: qty 250

## 2020-09-01 MED ORDER — DIPHENHYDRAMINE HCL 25 MG PO CAPS
ORAL_CAPSULE | ORAL | Status: AC
  Filled 2020-09-01: qty 1

## 2020-09-01 MED ORDER — ACETAMINOPHEN 325 MG PO TABS
ORAL_TABLET | ORAL | Status: AC
  Filled 2020-09-01: qty 1

## 2020-09-01 MED ORDER — TRASTUZUMAB-DKST CHEMO 150 MG IV SOLR
6.0000 mg/kg | Freq: Once | INTRAVENOUS | Status: AC
  Administered 2020-09-01: 378 mg via INTRAVENOUS
  Filled 2020-09-01: qty 18

## 2020-09-01 MED ORDER — DIPHENHYDRAMINE HCL 25 MG PO CAPS
25.0000 mg | ORAL_CAPSULE | Freq: Once | ORAL | Status: AC
Start: 2020-09-01 — End: 2020-09-01
  Administered 2020-09-01: 25 mg via ORAL

## 2020-09-01 NOTE — Patient Instructions (Signed)
[facility]  Discharge Instructions: Thank you for choosing [facility] to provide your oncology and hematology care.   If you have a lab appointment with the Cancer Center, please go directly to the Cancer Center and check in at the registration area.   Wear comfortable clothing and clothing appropriate for easy access to any Portacath or PICC line.   We strive to give you quality time with your provider. You may need to reschedule your appointment if you arrive late (15 or more minutes).  Arriving late affects you and other patients whose appointments are after yours.  Also, if you miss three or more appointments without notifying the office, you may be dismissed from the clinic at the provider's discretion.      For prescription refill requests, have your pharmacy contact our office and allow 72 hours for refills to be completed.    Today you received the following chemotherapy and/or immunotherapy agents Ogivri      To help prevent nausea and vomiting after your treatment, we encourage you to take your nausea medication as directed.  BELOW ARE SYMPTOMS THAT SHOULD BE REPORTED IMMEDIATELY: *FEVER GREATER THAN 100.4 F (38 C) OR HIGHER *CHILLS OR SWEATING *NAUSEA AND VOMITING THAT IS NOT CONTROLLED WITH YOUR NAUSEA MEDICATION *UNUSUAL SHORTNESS OF BREATH *UNUSUAL BRUISING OR BLEEDING *URINARY PROBLEMS (pain or burning when urinating, or frequent urination) *BOWEL PROBLEMS (unusual diarrhea, constipation, pain near the anus) TENDERNESS IN MOUTH AND THROAT WITH OR WITHOUT PRESENCE OF ULCERS (sore throat, sores in mouth, or a toothache) UNUSUAL RASH, SWELLING OR PAIN  UNUSUAL VAGINAL DISCHARGE OR ITCHING   Items with * indicate a potential emergency and should be followed up as soon as possible or go to the Emergency Department if any problems should occur.  Please show the CHEMOTHERAPY ALERT CARD or IMMUNOTHERAPY ALERT CARD at check-in to the  Emergency Department and triage nurse.  Should you have questions after your visit or need to cancel or reschedule your appointment, please contact [facility]  Dept: [phone]  and follow the prompts.  Office hours are 8:00 a.m. to 4:30 p.m. Monday - Friday. Please note that voicemails left after 4:00 p.m. may not be returned until the following business day.  We are closed weekends and major holidays. You have access to a nurse at all times for urgent questions. Please call the main number to the clinic Dept: [phone] and follow the prompts.   For any non-urgent questions, you may also contact your provider using MyChart. We now offer e-Visits for anyone 18 and older to request care online for non-urgent symptoms. For details visit [email or web address].   Also download the MyChart app! Go to the app store, search "MyChart", open the app, select [facility], and log in with your MyChart username and password.  Due to Covid, a mask is required upon entering the hospital/clinic. If you do not have a mask, one will be given to you upon arrival. For doctor visits, patients may have 1 support person aged 18 or older with them. For treatment visits, patients cannot have anyone with them due to current Covid guidelines and our immunocompromised population.   Trastuzumab injection for infusion What is this medication? TRASTUZUMAB (tras TOO zoo mab) is a monoclonal antibody. It is used to treatbreast cancer and stomach cancer. This medicine may be used for other purposes; ask your health care provider orpharmacist if you have questions. COMMON BRAND NAME(S): Herceptin, Herzuma,   KANJINTI, Ogivri, Ontruzant, Trazimera What should I tell my care team before I take this medication? They need to know if you have any of these conditions: heart disease heart failure lung or breathing disease, like asthma an unusual or allergic reaction to trastuzumab, benzyl alcohol, or  other medications, foods, dyes, or preservatives pregnant or trying to get pregnant breast-feeding How should I use this medication? This drug is given as an infusion into a vein. It is administered in a hospitalor clinic by a specially trained health care professional. Talk to your pediatrician regarding the use of this medicine in children. Thismedicine is not approved for use in children. Overdosage: If you think you have taken too much of this medicine contact apoison control center or emergency room at once. NOTE: This medicine is only for you. Do not share this medicine with others. What if I miss a dose? It is important not to miss a dose. Call your doctor or health careprofessional if you are unable to keep an appointment. What may interact with this medication? This medicine may interact with the following medications: certain types of chemotherapy, such as daunorubicin, doxorubicin, epirubicin, and idarubicin This list may not describe all possible interactions. Give your health care provider a list of all the medicines, herbs, non-prescription drugs, or dietary supplements you use. Also tell them if you smoke, drink alcohol, or use illegaldrugs. Some items may interact with your medicine. What should I watch for while using this medication? Visit your doctor for checks on your progress. Report any side effects. Continue your course of treatment even though you feel ill unless your doctortells you to stop. Call your doctor or health care professional for advice if you get a fever, chills or sore throat, or other symptoms of a cold or flu. Do not treatyourself. Try to avoid being around people who are sick. You may experience fever, chills and shaking during your first infusion. These effects are usually mild and can be treated with other medicines. Report any side effects during the infusion to your health care professional. Fever andchills usually do not happen with later infusions. Do  not become pregnant while taking this medicine or for 7 months after stopping it. Women should inform their doctor if they wish to become pregnant or think they might be pregnant. Women of child-bearing potential will need to have a negative pregnancy test before starting this medicine. There is a potential for serious side effects to an unborn child. Talk to your health care professional or pharmacist for more information. Do not breast-feed an infantwhile taking this medicine or for 7 months after stopping it. Women must use effective birth control with this medicine. What side effects may I notice from receiving this medication? Side effects that you should report to your doctor or health care professionalas soon as possible: allergic reactions like skin rash, itching or hives, swelling of the face, lips, or tongue chest pain or palpitations cough dizziness feeling faint or lightheaded, falls fever general ill feeling or flu-like symptoms signs of worsening heart failure like breathing problems; swelling in your legs and feet unusually weak or tired Side effects that usually do not require medical attention (report to yourdoctor or health care professional if they continue or are bothersome): bone pain changes in taste diarrhea joint pain nausea/vomiting weight loss This list may not describe all possible side effects. Call your doctor for medical advice about side effects. You may report side effects to FDA at1-800-FDA-1088. Where should I keep my   medication? This drug is given in a hospital or clinic and will not be stored at home. NOTE: This sheet is a summary. It may not cover all possible information. If you have questions about this medicine, talk to your doctor, pharmacist, orhealth care provider.  2022 Elsevier/Gold Standard (2016-02-02 14:37:52)   

## 2020-09-02 ENCOUNTER — Encounter: Payer: Self-pay | Admitting: *Deleted

## 2020-09-04 ENCOUNTER — Telehealth: Payer: Self-pay | Admitting: Oncology

## 2020-09-04 NOTE — Telephone Encounter (Signed)
Scheduled per 7/12 los. Called and spoke with pt confirmed 10/24 appts

## 2020-10-01 ENCOUNTER — Encounter: Payer: Self-pay | Admitting: Oncology

## 2020-10-23 ENCOUNTER — Other Ambulatory Visit: Payer: Self-pay

## 2020-10-23 ENCOUNTER — Ambulatory Visit
Admission: RE | Admit: 2020-10-23 | Discharge: 2020-10-23 | Disposition: A | Discharge status: Home / Self Care | Payer: Medicare Other | Source: Ambulatory Visit | Attending: Oncology | Admitting: Oncology

## 2020-10-23 DIAGNOSIS — R928 Other abnormal and inconclusive findings on diagnostic imaging of breast: Secondary | ICD-10-CM | POA: Diagnosis not present

## 2020-10-23 DIAGNOSIS — Z170 Estrogen receptor positive status [ER+]: Secondary | ICD-10-CM

## 2020-10-23 DIAGNOSIS — C50412 Malignant neoplasm of upper-outer quadrant of left female breast: Secondary | ICD-10-CM

## 2020-10-23 DIAGNOSIS — R922 Inconclusive mammogram: Secondary | ICD-10-CM | POA: Diagnosis not present

## 2020-12-07 ENCOUNTER — Encounter: Payer: Self-pay | Admitting: Oncology

## 2020-12-13 NOTE — Progress Notes (Signed)
[facility]  Telephone:(336) [phone] [phone]     ID: Holly Leon DOB: 1935-06-08  MR#: [id number]  JSH#:[id number]  Patient Care Team: Shon Baton, MD as PCP - General (Internal Medicine) Jovita Kussmaul, MD as Consulting Physician (General Surgery) Kyung Rudd, MD as Consulting Physician (Radiation Oncology) Ilia Dimaano, Virgie Dad, MD as Consulting Physician (Oncology) Mauro Kaufmann, RN as Oncology Nurse Navigator Rockwell Germany, RN as Oncology Nurse Navigator Larey Dresser, MD as Consulting Physician (Cardiology) Chauncey Cruel, MD OTHER MD:  CHIEF COMPLAINT: HER-2 positive estrogen receptor positive breast cancer  CURRENT TREATMENT: Observation   INTERVAL HISTORY: Holly Leon returns today for follow up of her Her2 positive breast cancer. She is now under observation as she declined antiestrogens.  Since her last visit, she underwent bilateral diagnostic mammography with tomography at [facility] on 10/23/2020 showing: breast density category B; no evidence of malignancy in either breast.    REVIEW OF SYSTEMS: Holly Leon is looking forward to her birthday in December.  One of her children will be visiting in November and the other 1 around the time of her birthday.  For exercise she walks 2-1/2 to 3 miles most days.  She has some discomfort in the right knee which comes and goes.  A detailed review of systems today was otherwise noncontributory   COVID 19 VACCINATION STATUS: refuses vaccination; has not had COVID as of October 2020.   HISTORY OF CURRENT ILLNESS: From the original intake note:  Holly Leon had routine screening mammography on 09/30/2019 showing a possible abnormality in the left breast. She underwent left diagnostic mammography with tomography and left breast ultrasonography at [facility] on 10/18/2019 showing: breast density category B; indeterminate 9 mm left breast mass at 1:30; no axillary  adenopathy.  Accordingly on 10/31/2019 she proceeded to biopsy of the left breast area in question. The pathology from this procedure ([id number]) showed: fibrocystic changes. On post-biopsy mammogram, the biopsy clip was found to have migrated approximately 3.6 cm medially. She returned for repeat biopsy on 11/11/2019, with pathology ([id number]) showing: invasive mammary carcinoma, grade 3, e-cadherin positive. Prognostic indicators significant for: estrogen receptor, 80% positive with moderate staining intensity and progesterone receptor, 0% negative. Proliferation marker Ki67 at 70%. HER2 equivocal by immunohistochemistry (2+), but positive by fluorescent in situ hybridization with a signals ratio 2.83 and number per cell 5.65.  [name]   PAST MEDICAL HISTORY: Past Medical History:  Diagnosis Date   History of left breast cancer    Hyperlipidemia    Osteoporosis    Postural dizziness with presyncope     PAST SURGICAL HISTORY: Past Surgical History:  Procedure Laterality Date   BREAST LUMPECTOMY WITH RADIOACTIVE SEED LOCALIZATION Left 01/22/2020   Procedure: LEFT BREAST LUMPECTOMY WITH RADIOACTIVE SEED LOCALIZATION;  Surgeon: Jovita Kussmaul, MD;  Location: [facility];  Service: General;  Laterality: Left;   TONSILLECTOMY     TUBAL LIGATION      FAMILY HISTORY: Family History  Problem Relation Age of Onset   Colon cancer Neg Hx    Esophageal cancer Neg Hx    Rectal cancer Neg Hx    Stomach cancer Neg Hx   The patient's father died at the age of 109 from heart disease. The patient's mother died at the age of 24 from heart disease. The patient has 1 brother who died at age 40 from heart disease. There have been some cancers on both sides  of the family but the patient really does not have information regarding what type of cancers they were or when the diagnoses were made. She will try to get that information for  Holly Leon   GYNECOLOGIC HISTORY:  No LMP recorded. Patient is postmenopausal. Menarche: 85 years old Age at first live birth: 85 years old Holly Leon 2 LMP early 23s Contraceptive early for several years without complications HRT no Hysterectomy? no BSO? no   SOCIAL HISTORY: (updated 11/2019)  Holly Leon is widowed and lives by herself with no pets. Her husband was in the [employer]. 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 Her son Holly Leon lives in [place] and her son Holly Leon lives in [place]. They both work in [employer]. The patient has no grandchildren. She attends [employer]    ADVANCED DIRECTIVES: Not in place. At the 12/10/2019 visit the patient was given the appropriate documents to complete and notarized at her discretion. She is planning to name her son Holly Leon as her healthcare power of attorney   HEALTH MAINTENANCE: Social History   Tobacco Use   Smoking status: Never   Smokeless tobacco: Never  Vaping Use   Vaping Use: Never used  Substance Use Topics   Alcohol use: No   Drug use: No     Colonoscopy: 12/2016  PAP: none on file  Bone density: none on file   Allergies  Allergen Reactions   Lipitor [Atorvastatin] Hives    Current Outpatient Medications  Medication Sig Dispense Refill   aspirin 81 MG chewable tablet Chew by mouth daily.     calcium citrate-vitamin D (CITRACAL+D) 315-200 MG-UNIT tablet Take 1 tablet by mouth 2 (two) times daily.     clotrimazole-betamethasone (LOTRISONE) lotion Apply topically 2 (two) times daily. 90 mL 2   Multiple Vitamins-Minerals (CENTRUM ADULTS PO) Take by mouth.     simvastatin (ZOCOR) 40 MG tablet Take 40 mg by mouth daily.     vitamin B-12 (CYANOCOBALAMIN) 500 MCG tablet Take 500 mcg by mouth daily.     vitamin C (ASCORBIC ACID) 500 MG tablet Take 500 mg by mouth daily.     vitamin E 400 UNIT capsule Take 400 Units by mouth daily.     No current  facility-administered medications for this visit.    OBJECTIVE: White woman in no acute distress  Vitals:   12/14/20 1550  BP: (!) 147/74  Pulse: 89  Resp: 18  Temp: 98.1 F (36.7 C)  SpO2: 98%       Body mass index is 26.13 kg/m.   Wt Readings from Last 3 Encounters:  12/14/20 138 lb 4.8 oz (62.7 kg)  09/01/20 135 lb 4 oz (61.3 kg)  08/11/20 135 lb 4.8 oz (61.4 kg)      ECOG FS:1 - Symptomatic but completely ambulatory   Sclerae unicteric, EOMs intact Wearing a mask No cervical or supraclavicular adenopathy Lungs no rales or rhonchi Heart regular rate and rhythm Abd soft, nontender, positive bowel sounds MSK no focal spinal tenderness, no upper extremity lymphedema Neuro: nonfocal, well oriented, appropriate affect Breasts: The right breast is benign.  The left breast is status postlumpectomy and radiation.  It is slightly smaller than the right but otherwise unremarkable.  Both axillae are benign   LAB RESULTS:  CMP     Component Value Date/Time   NA 140 09/01/2020 0907   K 4.0 09/01/2020 0907   CL 106 09/01/2020 0907  CO2 23 09/01/2020 0907   GLUCOSE 99 09/01/2020 0907   BUN 11 09/01/2020 0907   CREATININE 0.95 09/01/2020 0907   CALCIUM 9.7 09/01/2020 0907   PROT 8.1 09/01/2020 0907   ALBUMIN 3.7 09/01/2020 0907   AST 23 09/01/2020 0907   ALT 22 09/01/2020 0907   ALKPHOS 86 09/01/2020 0907   BILITOT 0.7 09/01/2020 0907   GFRNONAA 60 (L) 09/01/2020 0907   GFRAA  03/24/2008 1642    >60        The eGFR has been calculated using the MDRD equation. This calculation has not been validated in all clinical situations. eGFR's persistently <60 mL/min signify possible Chronic Kidney Disease.    No results found for: TOTALPROTELP, ALBUMINELP, A1GS, A2GS, BETS, BETA2SER, GAMS, MSPIKE, SPEI  Lab Results  Component Value Date   WBC 5.5 12/14/2020   NEUTROABS 3.5 12/14/2020   HGB 13.3 12/14/2020   HCT 38.6 12/14/2020   MCV 89.4 12/14/2020   PLT 273  12/14/2020    No results found for: LABCA2  No components found for: TDHRCB638  No results for input(s): INR in the last 168 hours.  No results found for: LABCA2  No results found for: GTX646  No results found for: OEH212  No results found for: YQM250  No results found for: CA2729  No components found for: HGQUANT  No results found for: CEA1 / No results found for: CEA1   No results found for: AFPTUMOR  No results found for: CHROMOGRNA  No results found for: KPAFRELGTCHN, LAMBDASER, KAPLAMBRATIO (kappa/lambda light chains)  No results found for: HGBA, HGBA2QUANT, HGBFQUANT, HGBSQUAN (Hemoglobinopathy evaluation)   No results found for: LDH  No results found for: IRON, TIBC, IRONPCTSAT (Iron and TIBC)  No results found for: FERRITIN  Urinalysis No results found for: COLORURINE, APPEARANCEUR, LABSPEC, PHURINE, GLUCOSEU, HGBUR, BILIRUBINUR, KETONESUR, PROTEINUR, UROBILINOGEN, NITRITE, LEUKOCYTESUR   STUDIES: No results found.   ELIGIBLE FOR AVAILABLE RESEARCH PROTOCOL: no   ASSESSMENT: 85 y.o. [place] woman status post left breast upper outer quadrant biopsy 11/11/2019 for a clinical T1b N0, stage IA invasive ductal carcinoma, grade 3, estrogen receptor positive, progesterone receptor negative, HER-2 amplified, with an MIB-1 of 70%  (1) s/Leon left lumpectomy w/o sentinel node sampling 01/22/2020 showed a pT1b cN0, stage IB invasive ductal carcinoma, grade 3, with negative margins  (2) anti-HER-2 immunotherapy starting 02/04/2020, completing 6 months 09/01/2020  (a) echo 01/02/2020 shows an EF of 70-75%  (B) echo 04/09/2020 shows an ejection fraction in the 65-70% range  (C) echo 08/27/2020 shows a hyperdynamic LV with an ejection fraction greater than 75%  (3) adjuvant radiation 03/10/2020 through 04/06/2020 Site Technique Total Dose (Gy) Dose per Fx (Gy) Completed Fx Beam Energies  Breast, Left: Breast_Lt 3D 42.56/42.56 2.66 16/16 6X  Breast, Left:  Breast_Lt_Bst specialPort 8/8 2 4/4 12E, 15E   (4) antiestrogens discussed 04/07/2020, patient opted against   PLAN:  Floyd is coming up in a year from definitive surgery for her breast cancer with no evidence of disease recurrence.  This is very favorable.  Even though she decided against antiestrogens she has a very good prognosis as her tumor was very small and HER2 positive and she received anti-HER2 treatment.  Her mammogram shows low density which is also favorable.  At this point we can start seeing her on a once a year basis until she completes her 5-year follow-up  Since she has not been vaccinated and has never had COVID I suggested that she wear a mask  in public and make sure she washes her hands just as we will do it at the beginning of the pandemic.  Though she is in very good health given her age she is at high risk.  I commended her wonderful exercise program  She knows to call for any other issue that may develop before the next visit  Total encounter time 20 minutes.Sarajane Jews C. Sabrea Sankey, MD 12/14/2020 4:03 PM Medical Oncology and Hematology [facility] , [place] [address] Tel. [phone]    Fax. [phone]   This document serves as a record of services personally performed by Lurline Del, MD. It was created on his behalf by Wilburn Mylar, a trained medical scribe. The creation of this record is based on the scribe's personal observations and the provider's statements to them.   I, Lurline Del MD, have reviewed the above documentation for accuracy and completeness, and I agree with the above.   *Total Encounter Time as defined by the Centers for Medicare and Medicaid Services includes, in addition to the face-to-face time of a patient visit (documented in the note above) non-face-to-face time: obtaining and reviewing outside history, ordering and reviewing medications, tests or procedures, care coordination  (communications with other health care professionals or caregivers) and documentation in the medical record.

## 2020-12-14 ENCOUNTER — Other Ambulatory Visit: Payer: Self-pay

## 2020-12-14 ENCOUNTER — Inpatient Hospital Stay (HOSPITAL_BASED_OUTPATIENT_CLINIC_OR_DEPARTMENT_OTHER): Payer: Medicare Other | Admitting: Oncology

## 2020-12-14 ENCOUNTER — Inpatient Hospital Stay: Payer: Medicare Other | Attending: Oncology

## 2020-12-14 VITALS — BP 147/74 | HR 89 | Temp 98.1°F | Resp 18 | Ht 61.0 in | Wt 138.3 lb

## 2020-12-14 DIAGNOSIS — Z853 Personal history of malignant neoplasm of breast: Secondary | ICD-10-CM | POA: Diagnosis not present

## 2020-12-14 DIAGNOSIS — Z79899 Other long term (current) drug therapy: Secondary | ICD-10-CM | POA: Insufficient documentation

## 2020-12-14 DIAGNOSIS — Z170 Estrogen receptor positive status [ER+]: Secondary | ICD-10-CM | POA: Insufficient documentation

## 2020-12-14 DIAGNOSIS — Z7982 Long term (current) use of aspirin: Secondary | ICD-10-CM | POA: Insufficient documentation

## 2020-12-14 DIAGNOSIS — C50412 Malignant neoplasm of upper-outer quadrant of left female breast: Secondary | ICD-10-CM

## 2020-12-14 LAB — CBC WITH DIFFERENTIAL (CANCER CENTER ONLY)
Abs Immature Granulocytes: 0.01 10*3/uL (ref 0.00–0.07)
Basophils Absolute: 0 10*3/uL (ref 0.0–0.1)
Basophils Relative: 1 %
Eosinophils Absolute: 0.3 10*3/uL (ref 0.0–0.5)
Eosinophils Relative: 5 %
HCT: 38.6 % (ref 36.0–46.0)
Hemoglobin: 13.3 g/dL (ref 12.0–15.0)
Immature Granulocytes: 0 %
Lymphocytes Relative: 21 %
Lymphs Abs: 1.1 10*3/uL (ref 0.7–4.0)
MCH: 30.8 pg (ref 26.0–34.0)
MCHC: 34.5 g/dL (ref 30.0–36.0)
MCV: 89.4 fL (ref 80.0–100.0)
Monocytes Absolute: 0.6 10*3/uL (ref 0.1–1.0)
Monocytes Relative: 10 %
Neutro Abs: 3.5 10*3/uL (ref 1.7–7.7)
Neutrophils Relative %: 63 %
Platelet Count: 273 10*3/uL (ref 150–400)
RBC: 4.32 MIL/uL (ref 3.87–5.11)
RDW: 13.2 % (ref 11.5–15.5)
WBC Count: 5.5 10*3/uL (ref 4.0–10.5)
nRBC: 0 % (ref 0.0–0.2)

## 2020-12-14 LAB — CMP (CANCER CENTER ONLY)
ALT: 22 U/L (ref 0–44)
AST: 22 U/L (ref 15–41)
Albumin: 3.9 g/dL (ref 3.5–5.0)
Alkaline Phosphatase: 73 U/L (ref 38–126)
Anion gap: 10 (ref 5–15)
BUN: 13 mg/dL (ref 8–23)
CO2: 23 mmol/L (ref 22–32)
Calcium: 9.6 mg/dL (ref 8.9–10.3)
Chloride: 106 mmol/L (ref 98–111)
Creatinine: 0.95 mg/dL (ref 0.44–1.00)
GFR, Estimated: 60 mL/min — ABNORMAL LOW (ref >60)
Glucose, Bld: 99 mg/dL (ref 70–99)
Potassium: 3.9 mmol/L (ref 3.5–5.1)
Sodium: 139 mmol/L (ref 135–145)
Total Bilirubin: 0.6 mg/dL (ref 0.3–1.2)
Total Protein: 8 g/dL (ref 6.5–8.1)

## 2021-04-06 ENCOUNTER — Encounter (HOSPITAL_COMMUNITY): Payer: Self-pay

## 2021-05-03 DIAGNOSIS — R739 Hyperglycemia, unspecified: Secondary | ICD-10-CM | POA: Diagnosis not present

## 2021-05-03 DIAGNOSIS — E785 Hyperlipidemia, unspecified: Secondary | ICD-10-CM | POA: Diagnosis not present

## 2021-05-03 DIAGNOSIS — M810 Age-related osteoporosis without current pathological fracture: Secondary | ICD-10-CM | POA: Diagnosis not present

## 2021-05-10 DIAGNOSIS — Z1331 Encounter for screening for depression: Secondary | ICD-10-CM | POA: Diagnosis not present

## 2021-05-10 DIAGNOSIS — C50919 Malignant neoplasm of unspecified site of unspecified female breast: Secondary | ICD-10-CM | POA: Diagnosis not present

## 2021-05-10 DIAGNOSIS — M810 Age-related osteoporosis without current pathological fracture: Secondary | ICD-10-CM | POA: Diagnosis not present

## 2021-05-10 DIAGNOSIS — E785 Hyperlipidemia, unspecified: Secondary | ICD-10-CM | POA: Diagnosis not present

## 2021-05-10 DIAGNOSIS — M1990 Unspecified osteoarthritis, unspecified site: Secondary | ICD-10-CM | POA: Diagnosis not present

## 2021-05-10 DIAGNOSIS — Z1339 Encounter for screening examination for other mental health and behavioral disorders: Secondary | ICD-10-CM | POA: Diagnosis not present

## 2021-05-10 DIAGNOSIS — I340 Nonrheumatic mitral (valve) insufficiency: Secondary | ICD-10-CM | POA: Diagnosis not present

## 2021-05-10 DIAGNOSIS — R82998 Other abnormal findings in urine: Secondary | ICD-10-CM | POA: Diagnosis not present

## 2021-05-10 DIAGNOSIS — Z0000 Encounter for general adult medical examination without abnormal findings: Secondary | ICD-10-CM | POA: Diagnosis not present

## 2021-05-10 DIAGNOSIS — R030 Elevated blood-pressure reading, without diagnosis of hypertension: Secondary | ICD-10-CM | POA: Diagnosis not present

## 2021-05-10 DIAGNOSIS — R739 Hyperglycemia, unspecified: Secondary | ICD-10-CM | POA: Diagnosis not present

## 2021-07-27 ENCOUNTER — Other Ambulatory Visit: Payer: Self-pay | Admitting: Oncology

## 2021-09-09 ENCOUNTER — Other Ambulatory Visit: Payer: Self-pay

## 2021-09-09 DIAGNOSIS — M810 Age-related osteoporosis without current pathological fracture: Secondary | ICD-10-CM | POA: Insufficient documentation

## 2021-09-14 ENCOUNTER — Telehealth: Payer: Self-pay | Admitting: Pharmacy Technician

## 2021-09-14 NOTE — Telephone Encounter (Addendum)
Auth Submission: NO AUTH NEEDED Payer: MEDICARE A/B PRIMARY TRICARE FOR LIFE SECONDARY PHONE: [phone] Medication & CPT/J Code(s) submitted: Prolia (Denosumab) [phone] Route of submission (phone, fax, portal): PHONE Auth type: Buy/Bill Units/visits requested: X1 DOSE Reference number: [id number] REF# [id number] TRICARE REF# Chasity-T 09/14/21 10:23a Approval from: 09/14/21 to 02/20/22     Medicare and tricare coverage confirmed and approval extended to 02/21/23

## 2021-09-15 ENCOUNTER — Ambulatory Visit (INDEPENDENT_AMBULATORY_CARE_PROVIDER_SITE_OTHER): Payer: Medicare Other

## 2021-09-15 VITALS — BP 148/76 | HR 97 | Temp 98.4°F | Resp 18 | Ht 62.0 in | Wt 140.4 lb

## 2021-09-15 DIAGNOSIS — M810 Age-related osteoporosis without current pathological fracture: Secondary | ICD-10-CM

## 2021-09-15 MED ORDER — DENOSUMAB 60 MG/ML ~~LOC~~ SOSY
60.0000 mg | PREFILLED_SYRINGE | Freq: Once | SUBCUTANEOUS | Status: AC
  Administered 2021-09-15: 60 mg via SUBCUTANEOUS
  Filled 2021-09-15: qty 1

## 2021-09-15 NOTE — Progress Notes (Signed)
Diagnosis: Osteoporosis  Provider:  Marshell Garfinkel, MD  Procedure: Injection  Prolia (Denosumab), Dose: 60 mg, Site: subcutaneous, Number of injections: 1  Discharge: Condition: Good, Destination: Home . AVS provided to patient.   Performed by:  Cleophus Molt, RN

## 2021-09-21 ENCOUNTER — Other Ambulatory Visit: Payer: Self-pay | Admitting: Internal Medicine

## 2021-09-21 DIAGNOSIS — Z853 Personal history of malignant neoplasm of breast: Secondary | ICD-10-CM

## 2021-09-21 DIAGNOSIS — Z9889 Other specified postprocedural states: Secondary | ICD-10-CM

## 2021-10-27 ENCOUNTER — Ambulatory Visit
Admission: RE | Admit: 2021-10-27 | Discharge: 2021-10-27 | Disposition: A | Discharge status: Home / Self Care | Payer: Medicare Other | Source: Ambulatory Visit | Attending: Internal Medicine | Admitting: Internal Medicine

## 2021-10-27 DIAGNOSIS — Z853 Personal history of malignant neoplasm of breast: Secondary | ICD-10-CM

## 2021-10-27 DIAGNOSIS — Z9889 Other specified postprocedural states: Secondary | ICD-10-CM

## 2021-10-27 DIAGNOSIS — R928 Other abnormal and inconclusive findings on diagnostic imaging of breast: Secondary | ICD-10-CM | POA: Diagnosis not present

## 2021-12-13 ENCOUNTER — Other Ambulatory Visit: Payer: Self-pay | Admitting: *Deleted

## 2021-12-13 DIAGNOSIS — C50412 Malignant neoplasm of upper-outer quadrant of left female breast: Secondary | ICD-10-CM

## 2021-12-14 ENCOUNTER — Other Ambulatory Visit: Payer: Self-pay

## 2021-12-14 ENCOUNTER — Inpatient Hospital Stay (HOSPITAL_BASED_OUTPATIENT_CLINIC_OR_DEPARTMENT_OTHER): Payer: Medicare Other | Admitting: Hematology and Oncology

## 2021-12-14 ENCOUNTER — Inpatient Hospital Stay: Payer: Medicare Other | Attending: Hematology and Oncology

## 2021-12-14 ENCOUNTER — Encounter: Payer: Self-pay | Admitting: Hematology and Oncology

## 2021-12-14 VITALS — BP 150/75 | HR 88 | Temp 97.9°F | Resp 16 | Ht 62.0 in | Wt 141.5 lb

## 2021-12-14 DIAGNOSIS — Z79899 Other long term (current) drug therapy: Secondary | ICD-10-CM | POA: Diagnosis not present

## 2021-12-14 DIAGNOSIS — Z853 Personal history of malignant neoplasm of breast: Secondary | ICD-10-CM | POA: Diagnosis not present

## 2021-12-14 DIAGNOSIS — Z170 Estrogen receptor positive status [ER+]: Secondary | ICD-10-CM

## 2021-12-14 DIAGNOSIS — Z7982 Long term (current) use of aspirin: Secondary | ICD-10-CM | POA: Diagnosis not present

## 2021-12-14 DIAGNOSIS — C50412 Malignant neoplasm of upper-outer quadrant of left female breast: Secondary | ICD-10-CM

## 2021-12-14 DIAGNOSIS — M810 Age-related osteoporosis without current pathological fracture: Secondary | ICD-10-CM | POA: Diagnosis not present

## 2021-12-14 LAB — CBC WITH DIFFERENTIAL (CANCER CENTER ONLY)
Abs Immature Granulocytes: 0.01 10*3/uL (ref 0.00–0.07)
Basophils Absolute: 0.1 10*3/uL (ref 0.0–0.1)
Basophils Relative: 1 %
Eosinophils Absolute: 0.3 10*3/uL (ref 0.0–0.5)
Eosinophils Relative: 6 %
HCT: 41.3 % (ref 36.0–46.0)
Hemoglobin: 14 g/dL (ref 12.0–15.0)
Immature Granulocytes: 0 %
Lymphocytes Relative: 23 %
Lymphs Abs: 1.1 10*3/uL (ref 0.7–4.0)
MCH: 30.7 pg (ref 26.0–34.0)
MCHC: 33.9 g/dL (ref 30.0–36.0)
MCV: 90.6 fL (ref 80.0–100.0)
Monocytes Absolute: 0.5 10*3/uL (ref 0.1–1.0)
Monocytes Relative: 10 %
Neutro Abs: 3 10*3/uL (ref 1.7–7.7)
Neutrophils Relative %: 60 %
Platelet Count: 267 10*3/uL (ref 150–400)
RBC: 4.56 MIL/uL (ref 3.87–5.11)
RDW: 13 % (ref 11.5–15.5)
WBC Count: 5.1 10*3/uL (ref 4.0–10.5)
nRBC: 0 % (ref 0.0–0.2)

## 2021-12-14 LAB — CMP (CANCER CENTER ONLY)
ALT: 17 U/L (ref 0–44)
AST: 18 U/L (ref 15–41)
Albumin: 4.3 g/dL (ref 3.5–5.0)
Alkaline Phosphatase: 66 U/L (ref 38–126)
Anion gap: 7 (ref 5–15)
BUN: 15 mg/dL (ref 8–23)
CO2: 27 mmol/L (ref 22–32)
Calcium: 9.6 mg/dL (ref 8.9–10.3)
Chloride: 106 mmol/L (ref 98–111)
Creatinine: 0.89 mg/dL (ref 0.44–1.00)
GFR, Estimated: >60 mL/min (ref >60)
Glucose, Bld: 106 mg/dL — ABNORMAL HIGH (ref 70–99)
Potassium: 3.7 mmol/L (ref 3.5–5.1)
Sodium: 140 mmol/L (ref 135–145)
Total Bilirubin: 0.5 mg/dL (ref 0.3–1.2)
Total Protein: 8 g/dL (ref 6.5–8.1)

## 2021-12-14 NOTE — Progress Notes (Signed)
[facility]  Telephone:(336) [phone] Fax:(336) [phone]     ID: Holly Leon DOB: 06-01-1935  MR#: [id number]  IWP#:[id number]  Patient Care Team: Shon Baton, MD as PCP - General (Internal Medicine) Jovita Kussmaul, MD as Consulting Physician (General Surgery) Kyung Rudd, MD as Consulting Physician (Radiation Oncology) Magrinat, Virgie Dad, MD (Inactive) as Consulting Physician (Oncology) Mauro Kaufmann, RN as Oncology Nurse Navigator Rockwell Germany, RN as Oncology Nurse Navigator Larey Dresser, MD as Consulting Physician (Cardiology) Benay Pike, MD OTHER MD:  CHIEF COMPLAINT: HER-2 positive estrogen receptor positive breast cancer  CURRENT TREATMENT: Observation   INTERVAL HISTORY: Holly Leon returns today for follow up of her Her2 positive breast cancer. She is now under observation as she declined antiestrogens. Since last visit, she has been doing ok. 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 She denies any changes in her breast.  She continues to walk about 2 to 3 miles a day. Since her last visit, she had a mammogram, no concern for malignancy. Rest of the pertinent 10 point ROS reviewed and negative  COVID 19 VACCINATION STATUS: refuses vaccination; has not had COVID as of October 2020.   HISTORY OF CURRENT ILLNESS: From the original intake note:  Holly Leon had routine screening mammography on 09/30/2019 showing a possible abnormality in the left breast. She underwent left diagnostic mammography with tomography and left breast ultrasonography at [facility] on 10/18/2019 showing: breast density category B; indeterminate 9 mm left breast mass at 1:30; no axillary adenopathy.  Accordingly on 10/31/2019 she proceeded to biopsy of the left breast area in question. The pathology from this procedure ([id number]) showed: fibrocystic changes. On post-biopsy mammogram, the biopsy clip was found to have migrated approximately 3.6 cm  medially. She returned for repeat biopsy on 11/11/2019, with pathology ([id number]) showing: invasive mammary carcinoma, grade 3, e-cadherin positive. Prognostic indicators significant for: estrogen receptor, 80% positive with moderate staining intensity and progesterone receptor, 0% negative. Proliferation marker Ki67 at 70%. HER2 equivocal by immunohistochemistry (2+), but positive by fluorescent in situ hybridization with a signals ratio 2.83 and number per cell 5.65.  [name]   PAST MEDICAL HISTORY: Past Medical History:  Diagnosis Date   History of left breast cancer    Hyperlipidemia    Osteoporosis    Postural dizziness with presyncope     PAST SURGICAL HISTORY: Past Surgical History:  Procedure Laterality Date   BREAST LUMPECTOMY WITH RADIOACTIVE SEED LOCALIZATION Left 01/22/2020   Procedure: LEFT BREAST LUMPECTOMY WITH RADIOACTIVE SEED LOCALIZATION;  Surgeon: Jovita Kussmaul, MD;  Location: [facility];  Service: General;  Laterality: Left;   TONSILLECTOMY     TUBAL LIGATION      FAMILY HISTORY: Family History  Problem Relation Age of Onset   Colon cancer Neg Hx    Esophageal cancer Neg Hx    Rectal cancer Neg Hx    Stomach cancer Neg Hx   The patient's father died at the age of 47 from heart disease. The patient's mother died at the age of 71 from heart disease. The patient has 1 brother who died at age 53 from heart disease. There have been some cancers on both sides of the family but the patient really does not have information regarding what type of cancers they were or when the diagnoses were made. She will try to get that information for [place]   GYNECOLOGIC HISTORY:  No LMP recorded.  Patient is postmenopausal. Menarche: 86 years old Age at first live birth: 86 years old [place] P 2 LMP early 78s Contraceptive early for several years without complications HRT no Hysterectomy? no BSO? no   SOCIAL HISTORY:  (updated 11/2019)  [name] is widowed and lives by herself with no pets. Her husband was in the [employer]. 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 Her son Holly Leon lives in [place] and her son Holly Leon lives in [place]. They both work in [employer]. The patient has no grandchildren. She attends [employer]    ADVANCED DIRECTIVES: Not in place. At the 12/10/2019 visit the patient was given the appropriate documents to complete and notarized at her discretion. She is planning to name her son Holly Leon as her healthcare power of attorney   HEALTH MAINTENANCE: Social History   Tobacco Use   Smoking status: Never   Smokeless tobacco: Never  Vaping Use   Vaping Use: Never used  Substance Use Topics   Alcohol use: No   Drug use: No     Colonoscopy: 12/2016  PAP: none on file  Bone density: none on file   Allergies  Allergen Reactions   Lipitor [Atorvastatin] Hives    Current Outpatient Medications  Medication Sig Dispense Refill   aspirin 81 MG chewable tablet Chew by mouth daily.     calcium citrate-vitamin D (CITRACAL+D) 315-200 MG-UNIT tablet Take 1 tablet by mouth 2 (two) times daily.     clotrimazole-betamethasone (LOTRISONE) lotion Apply topically 2 (two) times daily. 90 mL 2   Multiple Vitamins-Minerals (CENTRUM ADULTS PO) Take by mouth.     simvastatin (ZOCOR) 40 MG tablet Take 40 mg by mouth daily.     vitamin B-12 (CYANOCOBALAMIN) 500 MCG tablet Take 500 mcg by mouth daily.     vitamin C (ASCORBIC ACID) 500 MG tablet Take 500 mg by mouth daily.     vitamin E 400 UNIT capsule Take 400 Units by mouth daily.     No current facility-administered medications for this visit.    OBJECTIVE: White woman in no acute distress  Vitals:   12/14/21 1421  BP: (!) 150/75  Pulse: 88  Resp: 16  Temp: 97.9 F (36.6 C)  SpO2: 98%       Body mass index is 25.88 kg/m.   Wt Readings from Last 3 Encounters:   12/14/21 141 lb 8 oz (64.2 kg)  09/15/21 140 lb 6.4 oz (63.7 kg)  12/14/20 138 lb 4.8 oz (62.7 kg)      ECOG FS:1 - Symptomatic but completely ambulatory   Physical Exam Constitutional:      Appearance: Normal appearance.  Chest:     Comments: Bilateral breasts inspected.  No palpable masses or regional adenopathy Abdominal:     Palpations: Abdomen is soft.  Musculoskeletal:        General: No swelling or tenderness.     Cervical back: Normal range of motion and neck supple. No rigidity.  Lymphadenopathy:     Cervical: No cervical adenopathy.  Skin:    General: Skin is warm and dry.  Neurological:     General: No focal deficit present.     Mental Status: She is alert.     LAB RESULTS:  CMP     Component Value Date/Time   NA 139 12/14/2020 1536   K 3.9 12/14/2020 1536   CL 106 12/14/2020 1536   CO2 23 12/14/2020 1536  GLUCOSE 99 12/14/2020 1536   BUN 13 12/14/2020 1536   CREATININE 0.95 12/14/2020 1536   CALCIUM 9.6 12/14/2020 1536   PROT 8.0 12/14/2020 1536   ALBUMIN 3.9 12/14/2020 1536   AST 22 12/14/2020 1536   ALT 22 12/14/2020 1536   ALKPHOS 73 12/14/2020 1536   BILITOT 0.6 12/14/2020 1536   GFRNONAA 60 (L) 12/14/2020 1536   GFRAA  03/24/2008 1642    >60        The eGFR has been calculated using the MDRD equation. This calculation has not been validated in all clinical situations. eGFR's persistently <60 mL/min signify possible Chronic Kidney Disease.    No results found for: "TOTALPROTELP", "ALBUMINELP", "A1GS", "A2GS", "BETS", "BETA2SER", "GAMS", "MSPIKE", "SPEI"  Lab Results  Component Value Date   WBC 5.1 12/14/2021   NEUTROABS 3.0 12/14/2021   HGB 14.0 12/14/2021   HCT 41.3 12/14/2021   MCV 90.6 12/14/2021   PLT 267 12/14/2021    No results found for: "LABCA2"  No components found for: "ZOXWRU045"  No results for input(s): "INR" in the last 168 hours.  No results found for: "LABCA2"  No results found for: "WUJ811"  No  results found for: "CAN125"  No results found for: "CAN153"  No results found for: "CA2729"  No components found for: "HGQUANT"  No results found for: "CEA1", "CEA" / No results found for: "CEA1", "CEA"   No results found for: "AFPTUMOR"  No results found for: "CHROMOGRNA"  No results found for: "KPAFRELGTCHN", "LAMBDASER", "KAPLAMBRATIO" (kappa/lambda light chains)  No results found for: "HGBA", "HGBA2QUANT", "HGBFQUANT", "HGBSQUAN" (Hemoglobinopathy evaluation)   No results found for: "LDH"  No results found for: "IRON", "TIBC", "IRONPCTSAT" (Iron and TIBC)  No results found for: "FERRITIN"  Urinalysis No results found for: "COLORURINE", "APPEARANCEUR", "LABSPEC", "PHURINE", "GLUCOSEU", "HGBUR", "BILIRUBINUR", "KETONESUR", "PROTEINUR", "UROBILINOGEN", "NITRITE", "LEUKOCYTESUR"   STUDIES: No results found.   ELIGIBLE FOR AVAILABLE RESEARCH PROTOCOL: no   ASSESSMENT: 86 y.o. [place] woman status post left breast upper outer quadrant biopsy 11/11/2019 for a clinical T1b N0, stage IA invasive ductal carcinoma, grade 3, estrogen receptor positive, progesterone receptor negative, HER-2 amplified, with an MIB-1 of 70%  (1) s/p left lumpectomy w/o sentinel node sampling 01/22/2020 showed a pT1b cN0, stage IB invasive ductal carcinoma, grade 3, with negative margins  (2) anti-HER-2 immunotherapy starting 02/04/2020, completing 6 months 09/01/2020  (a) echo 01/02/2020 shows an EF of 70-75%  (B) echo 04/09/2020 shows an ejection fraction in the 65-70% range  (C) echo 08/27/2020 shows a hyperdynamic LV with an ejection fraction greater than 75%  (3) adjuvant radiation 03/10/2020 through 04/06/2020 Site Technique Total Dose (Gy) Dose per Fx (Gy) Completed Fx Beam Energies  Breast, Left: Breast_Lt 3D 42.56/42.56 2.66 16/16 6X  Breast, Left: Breast_Lt_Bst specialPort 8/8 2 4/4 12E, 15E   (4) antiestrogens discussed 04/07/2020, patient opted against   PLAN:  Patient is  here for follow-up.  Since last visit, she has been doing really well.  No complaints or concerns for recurrence.  Physical examination today unremarkable.  No concerns for recurrence.  Last mammogram unremarkable.  She will be due for her next mammogram in September 2024. Encouraged staying active, regular exercise. She can return to clinic in 1 year or sooner as needed CBC today unremarkable.  CMP pending  Total time spent: 20 min  *Total Encounter Time as defined by the Centers for Medicare and Medicaid Services includes, in addition to the face-to-face time of a patient visit (documented in the note  above) non-face-to-face time: obtaining and reviewing outside history, ordering and reviewing medications, tests or procedures, care coordination (communications with other health care professionals or caregivers) and documentation in the medical record.

## 2022-02-14 IMAGING — MG DIGITAL SCREENING BILAT W/ TOMO W/ CAD
6 of 12 series · 6 of 36 positions shown · non-contrast
Comparison: Previous exam(s).

CLINICAL DATA: Screening.

EXAM:
DIGITAL SCREENING BILATERAL MAMMOGRAM WITH TOMO AND CAD

[R MLO synth-2D (1 of 2)]
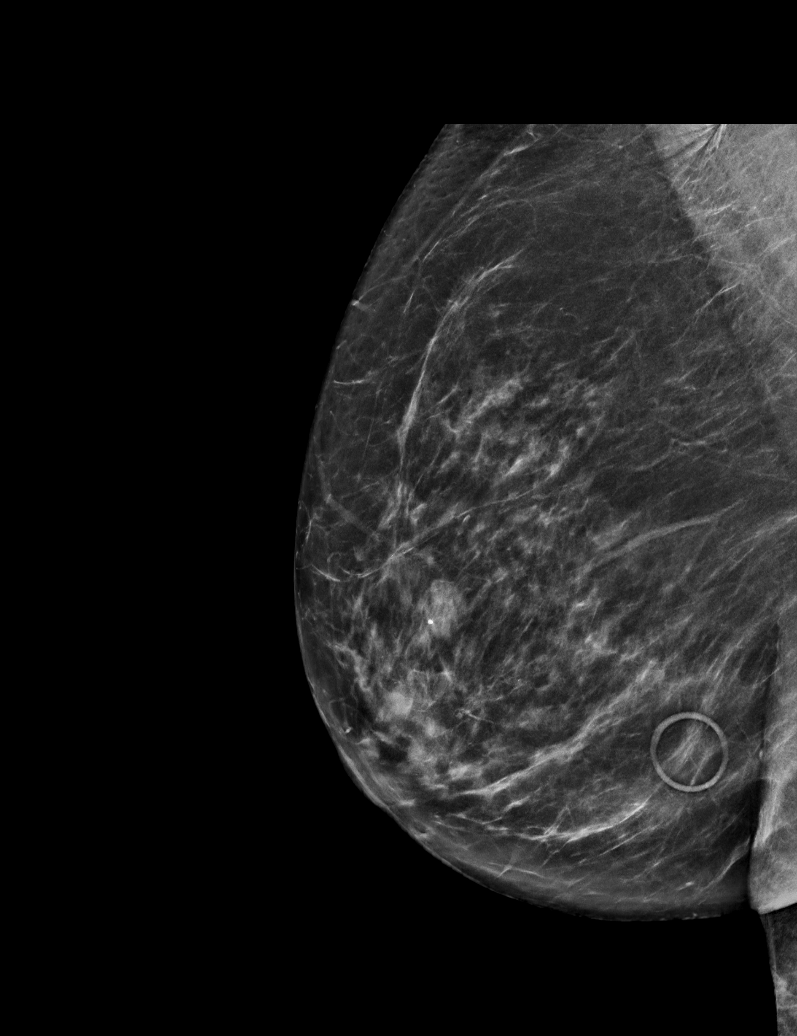

[L CC synth-2D]
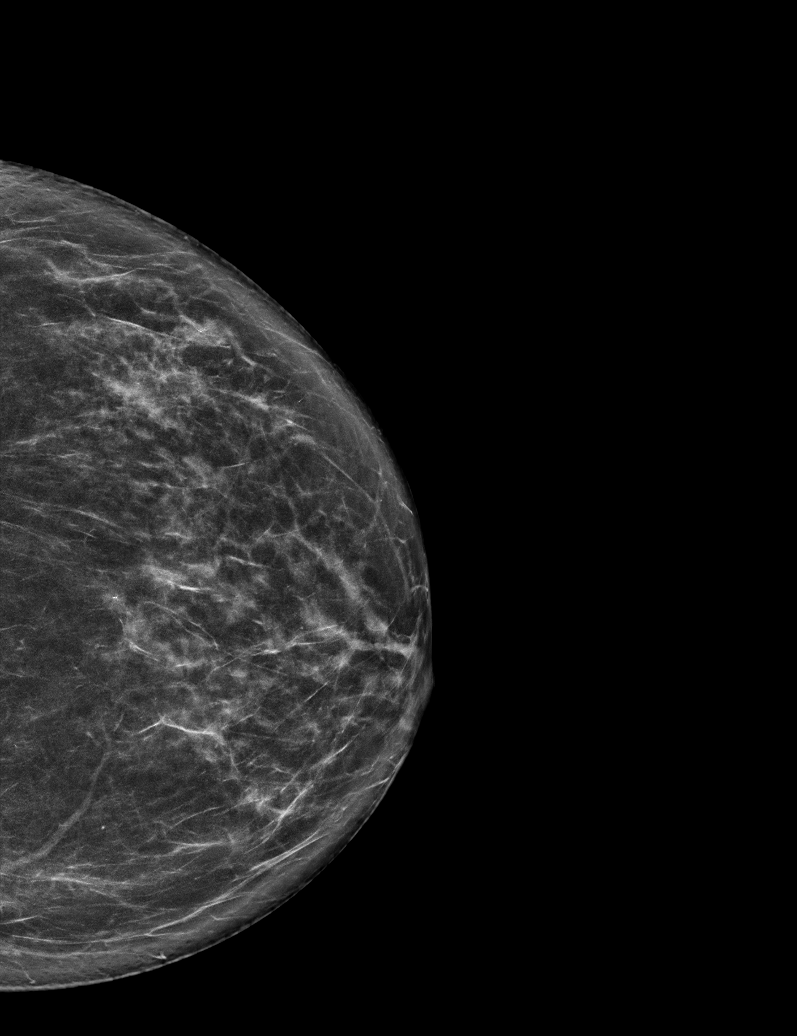

[L MLO synth-2D (1 of 2)]
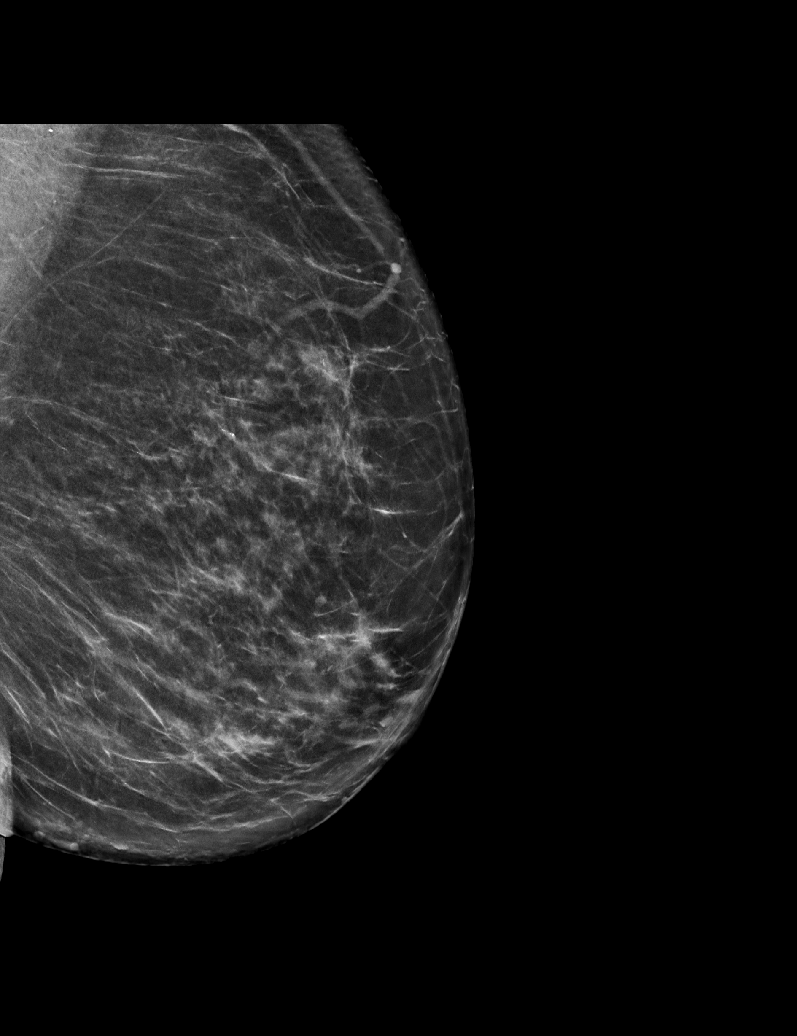

[L MLO synth-2D (2 of 2)]
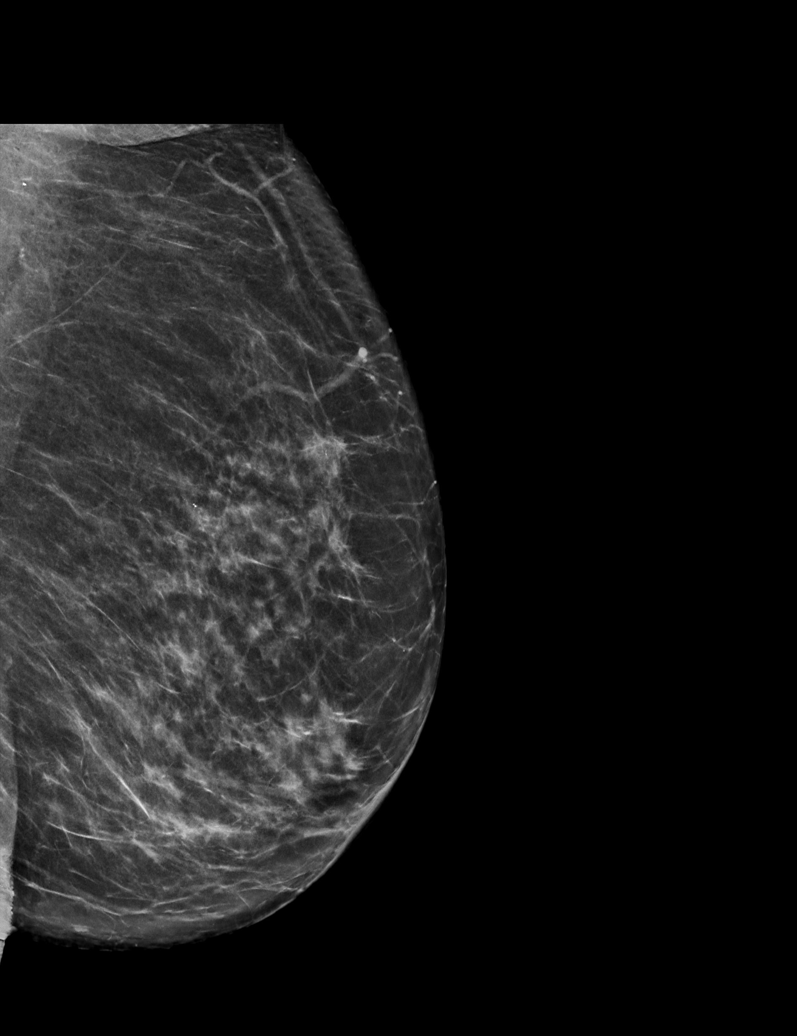

[R MLO synth-2D (2 of 2)]
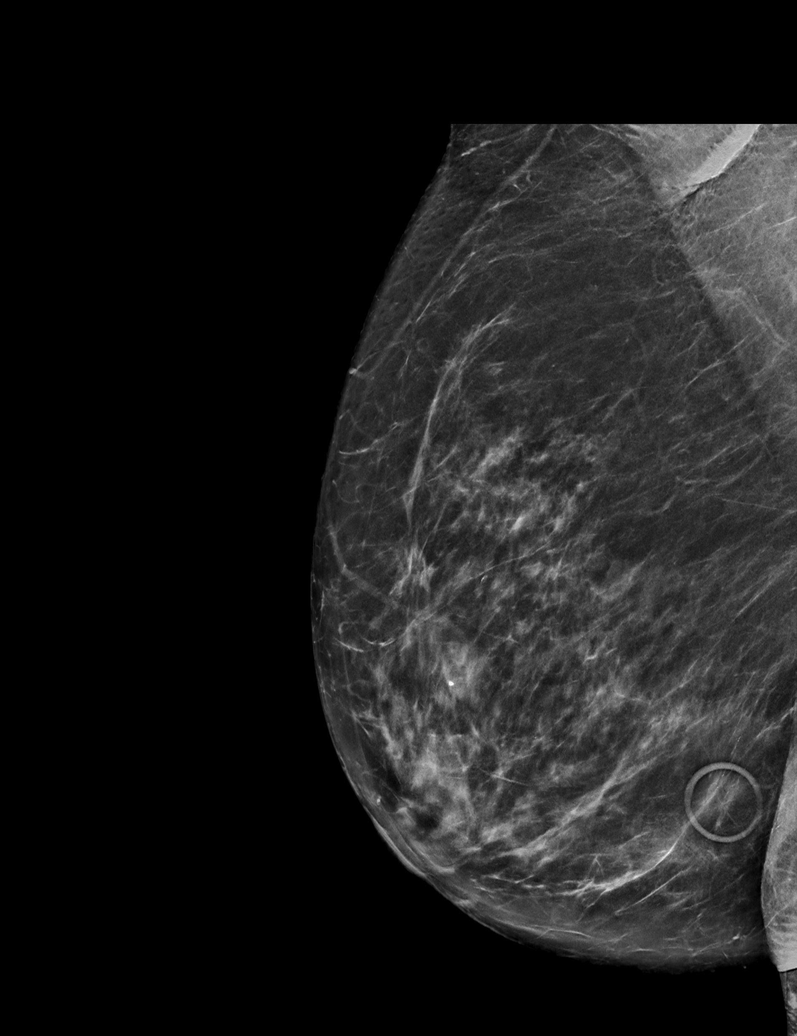

[R CC synth-2D]
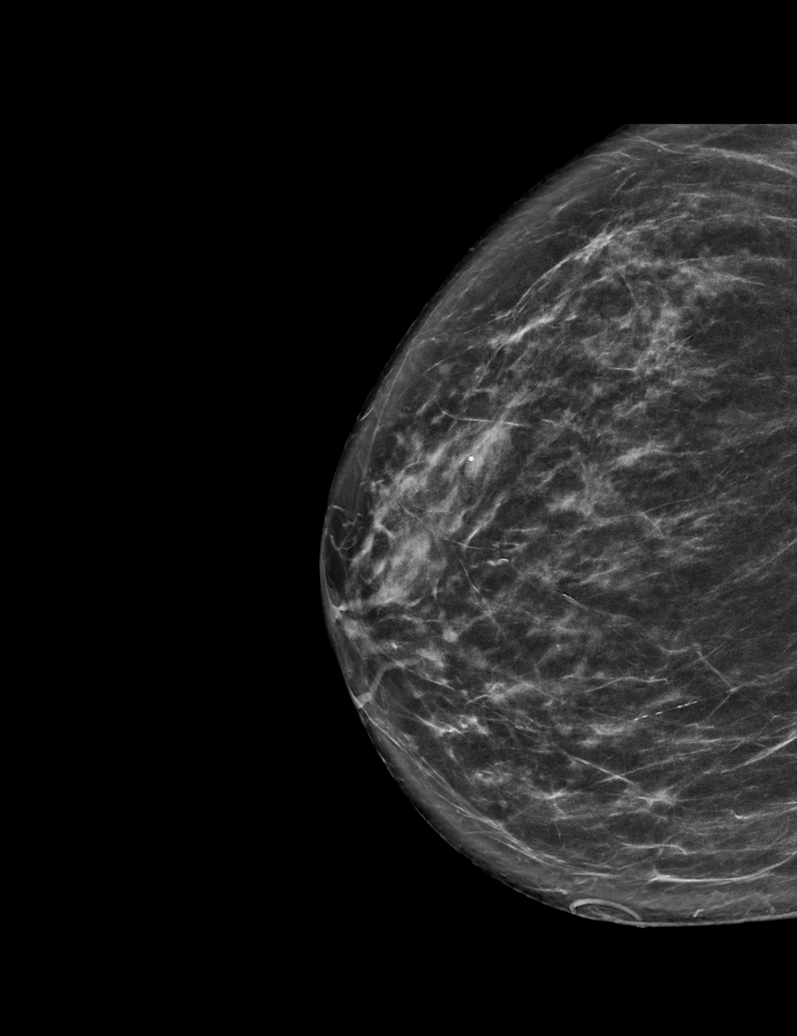

[6 of 36 positions shown; findings below may reference images not displayed]

ACR Breast Density Category b: There are scattered areas of
fibroglandular density.
FINDINGS: In the left breast, a possible asymmetry warrants further
evaluation. In the right breast, no findings suspicious for
malignancy. Images were processed with CAD.
IMPRESSION: Further evaluation is suggested for possible asymmetry in the left
breast.

RECOMMENDATION:
Diagnostic mammogram and possibly ultrasound of the left breast.
(Code:DI-5-LL4)

The patient will be contacted regarding the findings, and additional
imaging will be scheduled.

BI-RADS CATEGORY  0: Incomplete. Need additional imaging evaluation
and/or prior mammograms for comparison.

## 2022-04-20 ENCOUNTER — Encounter: Payer: Self-pay | Admitting: Pulmonary Disease

## 2022-04-20 ENCOUNTER — Ambulatory Visit: Payer: Medicare Other

## 2022-04-20 VITALS — BP 128/75 | HR 80 | Temp 97.8°F | Ht 62.0 in | Wt 139.2 lb

## 2022-04-20 DIAGNOSIS — M810 Age-related osteoporosis without current pathological fracture: Secondary | ICD-10-CM

## 2022-04-20 MED ORDER — DENOSUMAB 60 MG/ML ~~LOC~~ SOSY
60.0000 mg | PREFILLED_SYRINGE | Freq: Once | SUBCUTANEOUS | Status: AC
  Administered 2022-04-20: 60 mg via SUBCUTANEOUS
  Filled 2022-04-20: qty 1

## 2022-04-20 NOTE — Progress Notes (Signed)
Diagnosis: Osteoporosis  Provider:  Marshell Garfinkel MD  Procedure: Injection  Prolia (Denosumab), Dose: 60 mg, Site: subcutaneous, Number of injections: 1  Post Care:  n/a  Discharge: Condition: Good, Destination: Home . AVS Provided  Performed by:  Cleophus Molt, RN

## 2022-05-10 DIAGNOSIS — M810 Age-related osteoporosis without current pathological fracture: Secondary | ICD-10-CM | POA: Diagnosis not present

## 2022-05-10 DIAGNOSIS — R7989 Other specified abnormal findings of blood chemistry: Secondary | ICD-10-CM | POA: Diagnosis not present

## 2022-05-10 DIAGNOSIS — R739 Hyperglycemia, unspecified: Secondary | ICD-10-CM | POA: Diagnosis not present

## 2022-05-10 DIAGNOSIS — E785 Hyperlipidemia, unspecified: Secondary | ICD-10-CM | POA: Diagnosis not present

## 2022-05-10 DIAGNOSIS — R030 Elevated blood-pressure reading, without diagnosis of hypertension: Secondary | ICD-10-CM | POA: Diagnosis not present

## 2022-05-13 DIAGNOSIS — I340 Nonrheumatic mitral (valve) insufficiency: Secondary | ICD-10-CM | POA: Diagnosis not present

## 2022-05-13 DIAGNOSIS — R739 Hyperglycemia, unspecified: Secondary | ICD-10-CM | POA: Diagnosis not present

## 2022-05-13 DIAGNOSIS — M1990 Unspecified osteoarthritis, unspecified site: Secondary | ICD-10-CM | POA: Diagnosis not present

## 2022-05-13 DIAGNOSIS — E785 Hyperlipidemia, unspecified: Secondary | ICD-10-CM | POA: Diagnosis not present

## 2022-05-13 DIAGNOSIS — Z1331 Encounter for screening for depression: Secondary | ICD-10-CM | POA: Diagnosis not present

## 2022-05-13 DIAGNOSIS — M810 Age-related osteoporosis without current pathological fracture: Secondary | ICD-10-CM | POA: Diagnosis not present

## 2022-05-13 DIAGNOSIS — Z1339 Encounter for screening examination for other mental health and behavioral disorders: Secondary | ICD-10-CM | POA: Diagnosis not present

## 2022-05-13 DIAGNOSIS — R030 Elevated blood-pressure reading, without diagnosis of hypertension: Secondary | ICD-10-CM | POA: Diagnosis not present

## 2022-05-13 DIAGNOSIS — Z0000 Encounter for general adult medical examination without abnormal findings: Secondary | ICD-10-CM | POA: Diagnosis not present

## 2022-05-13 DIAGNOSIS — R82998 Other abnormal findings in urine: Secondary | ICD-10-CM | POA: Diagnosis not present

## 2022-05-13 DIAGNOSIS — C50919 Malignant neoplasm of unspecified site of unspecified female breast: Secondary | ICD-10-CM | POA: Diagnosis not present

## 2022-08-01 DIAGNOSIS — R042 Hemoptysis: Secondary | ICD-10-CM | POA: Diagnosis not present

## 2022-08-01 DIAGNOSIS — R0981 Nasal congestion: Secondary | ICD-10-CM | POA: Diagnosis not present

## 2022-08-01 DIAGNOSIS — Z1152 Encounter for screening for COVID-19: Secondary | ICD-10-CM | POA: Diagnosis not present

## 2022-08-01 DIAGNOSIS — R058 Other specified cough: Secondary | ICD-10-CM | POA: Diagnosis not present

## 2022-08-01 DIAGNOSIS — R5383 Other fatigue: Secondary | ICD-10-CM | POA: Diagnosis not present

## 2022-10-19 ENCOUNTER — Ambulatory Visit (INDEPENDENT_AMBULATORY_CARE_PROVIDER_SITE_OTHER): Payer: Medicare Other

## 2022-10-19 VITALS — BP 134/71 | HR 82 | Temp 98.0°F | Resp 18 | Ht 61.0 in | Wt 135.8 lb

## 2022-10-19 DIAGNOSIS — M810 Age-related osteoporosis without current pathological fracture: Secondary | ICD-10-CM

## 2022-10-19 MED ORDER — DENOSUMAB 60 MG/ML ~~LOC~~ SOSY
60.0000 mg | PREFILLED_SYRINGE | Freq: Once | SUBCUTANEOUS | Status: AC
  Administered 2022-10-19: 60 mg via SUBCUTANEOUS
  Filled 2022-10-19: qty 1

## 2022-10-19 NOTE — Progress Notes (Signed)
Diagnosis: Osteoporosis  Provider:  Mannam, Praveen MD  Procedure: Injection  Prolia (Denosumab), Dose: 60 mg, Site: subcutaneous, Number of injections: 1  Post Care: Patient declined observation  Discharge: Condition: Good, Destination: Home . AVS Provided  Performed by:  Sunita Ranabhat, RN       

## 2022-11-08 ENCOUNTER — Ambulatory Visit
Admission: RE | Admit: 2022-11-08 | Discharge: 2022-11-08 | Disposition: A | Discharge status: Home / Self Care | Payer: Medicare Other | Source: Ambulatory Visit | Attending: Hematology and Oncology | Admitting: Hematology and Oncology

## 2022-11-08 DIAGNOSIS — Z170 Estrogen receptor positive status [ER+]: Secondary | ICD-10-CM

## 2022-11-08 DIAGNOSIS — Z853 Personal history of malignant neoplasm of breast: Secondary | ICD-10-CM | POA: Diagnosis not present

## 2022-12-15 ENCOUNTER — Other Ambulatory Visit: Payer: Self-pay

## 2022-12-15 ENCOUNTER — Inpatient Hospital Stay: Payer: Medicare Other | Attending: Hematology and Oncology | Admitting: Hematology and Oncology

## 2022-12-15 VITALS — BP 144/63 | HR 94 | Temp 97.5°F | Resp 16 | Wt 139.1 lb

## 2022-12-15 DIAGNOSIS — Z170 Estrogen receptor positive status [ER+]: Secondary | ICD-10-CM

## 2022-12-15 DIAGNOSIS — Z923 Personal history of irradiation: Secondary | ICD-10-CM | POA: Diagnosis not present

## 2022-12-15 DIAGNOSIS — Z9221 Personal history of antineoplastic chemotherapy: Secondary | ICD-10-CM | POA: Diagnosis not present

## 2022-12-15 DIAGNOSIS — Z853 Personal history of malignant neoplasm of breast: Secondary | ICD-10-CM | POA: Insufficient documentation

## 2022-12-15 DIAGNOSIS — R21 Rash and other nonspecific skin eruption: Secondary | ICD-10-CM

## 2022-12-15 DIAGNOSIS — C50412 Malignant neoplasm of upper-outer quadrant of left female breast: Secondary | ICD-10-CM | POA: Diagnosis not present

## 2022-12-15 MED ORDER — CLOTRIMAZOLE-BETAMETHASONE 1-0.05 % EX LOTN: TOPICAL_LOTION | Freq: Two times a day (BID) | CUTANEOUS | 2 refills | Status: AC

## 2022-12-15 NOTE — Progress Notes (Signed)
[facility]  Telephone:([phone] [phone]     ID: Holly Leon DOB: 1935/10/10  MR#: [id number]  XLK#:[id number]  Patient Care Team: Creola Corn, MD as PCP - General (Internal Medicine) Griselda Miner, MD as Consulting Physician (General Surgery) Dorothy Puffer, MD as Consulting Physician (Radiation Oncology) Pershing Proud, RN as Oncology Nurse Navigator Donnelly Angelica, RN as Oncology Nurse Navigator Laurey Morale, MD as Consulting Physician (Cardiology) Rachel Moulds, MD as Medical Oncologist (Hematology and Oncology) Rachel Moulds, MD OTHER MD:  CHIEF COMPLAINT: HER-2 positive estrogen receptor positive breast cancer  CURRENT TREATMENT: Observation  INTERVAL HISTORY:  Holly Leon returns today for follow up of her Her2 positive breast cancer.  She is now under observation as she declined antiestrogens.  Since her last visit, she had a mammogram, no concern for malignancy. In the past year, she just went to see her primary care physician and had a good report.  She had her mammogram in September which was unremarkable for any evidence of malignancy.  She continues to stay active, walks regularly, denies any breast changes.  No change in breathing, bowel habits or urinary habits. Rest of the pertinent 10 point ROS reviewed and negative  COVID 19 VACCINATION STATUS: refuses vaccination; has not had COVID as of October 2020.   HISTORY OF CURRENT ILLNESS: From the original intake note:  Holly Leon had routine screening mammography on 09/30/2019 showing a possible abnormality in the left breast. She underwent left diagnostic mammography with tomography and left breast ultrasonography at [facility] on 10/18/2019 showing: breast density category B; indeterminate 9 mm left breast mass at 1:30; no axillary adenopathy.  Accordingly on 10/31/2019 she proceeded to biopsy of the left breast area in question. The pathology from this procedure  ([id number]) showed: fibrocystic changes. On post-biopsy mammogram, the biopsy clip was found to have migrated approximately 3.6 cm medially. She returned for repeat biopsy on 11/11/2019, with pathology ([id number]) showing: invasive mammary carcinoma, grade 3, e-cadherin positive. Prognostic indicators significant for: estrogen receptor, 80% positive with moderate staining intensity and progesterone receptor, 0% negative. Proliferation marker Ki67 at 70%. HER2 equivocal by immunohistochemistry (2+), but positive by fluorescent in situ hybridization with a signals ratio 2.83 and number per cell 5.65.  [name]   PAST MEDICAL HISTORY: Past Medical History:  Diagnosis Date   History of left breast cancer    Hyperlipidemia    Osteoporosis    Postural dizziness with presyncope     PAST SURGICAL HISTORY: Past Surgical History:  Procedure Laterality Date   BREAST LUMPECTOMY WITH RADIOACTIVE SEED LOCALIZATION Left 01/22/2020   Procedure: LEFT BREAST LUMPECTOMY WITH RADIOACTIVE SEED LOCALIZATION;  Surgeon: Griselda Miner, MD;  Location: [facility];  Service: General;  Laterality: Left;   TONSILLECTOMY     TUBAL LIGATION      FAMILY HISTORY: Family History  Problem Relation Age of Onset   Colon cancer Neg Hx    Esophageal cancer Neg Hx    Rectal cancer Neg Hx    Stomach cancer Neg Hx   The patient's father died at the age of 76 from heart disease. The patient's mother died at the age of 18 from heart disease. The patient has 1 brother who died at age 31 from heart disease. There have been some cancers on both sides of the family but the patient really does not have information regarding what type of cancers they were or when the  diagnoses were made. She will try to get that information for [place]   GYNECOLOGIC HISTORY:  No LMP recorded. Patient is postmenopausal. Menarche: 87 years old Age at first live birth: 87 years old GX P 2 LMP  early 49s Contraceptive early for several years without complications HRT no Hysterectomy? no BSO? no   SOCIAL HISTORY: (updated 11/2019)  Holly Leon is widowed and lives by herself with no pets. Her husband was in the [employer]. 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 Her son Holly Leon lives in [place] and her son Holly Leon lives in [place]. They both work in [employer]. The patient has no grandchildren. She attends [employer]    ADVANCED DIRECTIVES: Not in place. At the 12/10/2019 visit the patient was given the appropriate documents to complete and notarized at her discretion. She is planning to name her son Holly Leon as her healthcare power of attorney   HEALTH MAINTENANCE: Social History   Tobacco Use   Smoking status: Never   Smokeless tobacco: Never  Vaping Use   Vaping status: Never Used  Substance Use Topics   Alcohol use: No   Drug use: No     Colonoscopy: 12/2016  PAP: none on file  Bone density: none on file   Allergies  Allergen Reactions   Lipitor [Atorvastatin] Hives    Current Outpatient Medications  Medication Sig Dispense Refill   aspirin 81 MG chewable tablet Chew by mouth daily.     calcium citrate-vitamin D (CITRACAL+D) 315-200 MG-UNIT tablet Take 1 tablet by mouth 2 (two) times daily.     clotrimazole-betamethasone (LOTRISONE) lotion Apply topically 2 (two) times daily. 90 mL 2   Multiple Vitamins-Minerals (CENTRUM ADULTS PO) Take by mouth.     simvastatin (ZOCOR) 40 MG tablet Take 40 mg by mouth daily.     vitamin B-12 (CYANOCOBALAMIN) 500 MCG tablet Take 500 mcg by mouth daily.     vitamin C (ASCORBIC ACID) 500 MG tablet Take 500 mg by mouth daily.     vitamin E 400 UNIT capsule Take 400 Units by mouth daily.     No current facility-administered medications for this visit.    OBJECTIVE: White woman in no acute distress  Vitals:   12/15/22 0816  BP: (!) 144/63  Pulse: 94   Resp: 16  Temp: (!) 97.5 F (36.4 C)  SpO2: 99%       Body mass index is 26.28 kg/m.   Wt Readings from Last 3 Encounters:  12/15/22 139 lb 1.6 oz (63.1 kg)  10/19/22 135 lb 12.8 oz (61.6 kg)  04/20/22 139 lb 3.2 oz (63.1 kg)      ECOG FS:1 - Symptomatic but completely ambulatory   Physical Exam Constitutional:      Appearance: Normal appearance.  Chest:     Comments: Bilateral breasts inspected.  No palpable masses or regional adenopathy Abdominal:     Palpations: Abdomen is soft.  Musculoskeletal:        General: No swelling or tenderness.     Cervical back: Normal range of motion and neck supple. No rigidity.  Lymphadenopathy:     Cervical: No cervical adenopathy.  Skin:    General: Skin is warm and dry.  Neurological:     General: No focal deficit present.     Mental Status: She is alert.     LAB RESULTS:  CMP     Component Value Date/Time   NA 140  12/14/2021 1405   K 3.7 12/14/2021 1405   CL 106 12/14/2021 1405   CO2 27 12/14/2021 1405   GLUCOSE 106 (H) 12/14/2021 1405   BUN 15 12/14/2021 1405   CREATININE 0.89 12/14/2021 1405   CALCIUM 9.6 12/14/2021 1405   PROT 8.0 12/14/2021 1405   ALBUMIN 4.3 12/14/2021 1405   AST 18 12/14/2021 1405   ALT 17 12/14/2021 1405   ALKPHOS 66 12/14/2021 1405   BILITOT 0.5 12/14/2021 1405   GFRNONAA >60 12/14/2021 1405   GFRAA  03/24/2008 1642    >60        The eGFR has been calculated using the MDRD equation. This calculation has not been validated in all clinical situations. eGFR's persistently <60 mL/min signify possible Chronic Kidney Disease.    No results found for: "TOTALPROTELP", "ALBUMINELP", "A1GS", "A2GS", "BETS", "BETA2SER", "GAMS", "MSPIKE", "SPEI"  Lab Results  Component Value Date   WBC 5.1 12/14/2021   NEUTROABS 3.0 12/14/2021   HGB 14.0 12/14/2021   HCT 41.3 12/14/2021   MCV 90.6 12/14/2021   PLT 267 12/14/2021    No results found for: "LABCA2"  No components found for:  "OZHYQM578"  No results for input(s): "INR" in the last 168 hours.  No results found for: "LABCA2"  No results found for: "ION629"  No results found for: "CAN125"  No results found for: "CAN153"  No results found for: "CA2729"  No components found for: "HGQUANT"  No results found for: "CEA1", "CEA" / No results found for: "CEA1", "CEA"   No results found for: "AFPTUMOR"  No results found for: "CHROMOGRNA"  No results found for: "KPAFRELGTCHN", "LAMBDASER", "KAPLAMBRATIO" (kappa/lambda light chains)  No results found for: "HGBA", "HGBA2QUANT", "HGBFQUANT", "HGBSQUAN" (Hemoglobinopathy evaluation)   No results found for: "LDH"  No results found for: "IRON", "TIBC", "IRONPCTSAT" (Iron and TIBC)  No results found for: "FERRITIN"  Urinalysis No results found for: "COLORURINE", "APPEARANCEUR", "LABSPEC", "PHURINE", "GLUCOSEU", "HGBUR", "BILIRUBINUR", "KETONESUR", "PROTEINUR", "UROBILINOGEN", "NITRITE", "LEUKOCYTESUR"   STUDIES: No results found.   ELIGIBLE FOR AVAILABLE RESEARCH PROTOCOL: no   ASSESSMENT: 87 y.o. Holly Leon woman status post left breast upper outer quadrant biopsy 11/11/2019 for a clinical T1b N0, stage IA invasive ductal carcinoma, grade 3, estrogen receptor positive, progesterone receptor negative, HER-2 amplified, with an MIB-1 of 70%  (1) s/p left lumpectomy w/o sentinel node sampling 01/22/2020 showed a pT1b cN0, stage IB invasive ductal carcinoma, grade 3, with negative margins  (2) anti-HER-2 immunotherapy starting 02/04/2020, completing 6 months 09/01/2020  (a) echo 01/02/2020 shows an EF of 70-75%  (B) echo 04/09/2020 shows an ejection fraction in the 65-70% range  (C) echo 08/27/2020 shows a hyperdynamic LV with an ejection fraction greater than 75%  (3) adjuvant radiation 03/10/2020 through 04/06/2020 Site Technique Total Dose (Gy) Dose per Fx (Gy) Completed Fx Beam Energies  Breast, Left: Breast_Lt 3D 42.56/42.56 2.66 16/16 6X  Breast,  Left: Breast_Lt_Bst specialPort 8/8 2 4/4 12E, 15E   (4) antiestrogens discussed 04/07/2020, patient opted against   PLAN:  Ms. Nannette is here for follow-up since her last visit in October 2023.  She has been doing remarkably well.  No concerns on review of systems.  Physical examination with left breast surgical scarring, no palpable masses or regional adenopathy She opted against antiestrogens. Mammogram due for September 2025, this has been ordered She also had a quested a refill for the clotrimazole lotion for fungal rash in the inframammary region, this has been refilled. Total time spent: 20 min  *Total Encounter  Time as defined by the Centers for Medicare and Medicaid Services includes, in addition to the face-to-face time of a patient visit (documented in the note above) non-face-to-face time: obtaining and reviewing outside history, ordering and reviewing medications, tests or procedures, care coordination (communications with other health care professionals or caregivers) and documentation in the medical record.

## 2022-12-23 ENCOUNTER — Telehealth: Payer: Self-pay | Admitting: Pharmacy Technician

## 2022-12-23 NOTE — Telephone Encounter (Signed)
Auth Submission: NO AUTH NEEDED Site of care: Site of care: [facility] Payer: medicare a/b Medication & CPT/J Code(s) submitted: Prolia (Denosumab) [id number] Route of submission (phone, fax, portal):  Phone # Fax # Auth type: Buy/Bill PB Units/visits requested: 2 Reference number:  Approval from: 12/23/22 to 02/21/23

## 2023-04-20 ENCOUNTER — Telehealth: Payer: Self-pay

## 2023-04-20 NOTE — Telephone Encounter (Signed)
 Auth Submission: NO AUTH NEEDED Site of care: Site of care: [facility] Payer: medicare a/b Medication & CPT/J Code(s) submitted: Prolia (Denosumab) [id number] Route of submission (phone, fax, portal):  Phone # Fax # Auth type: Buy/Bill PB Units/visits requested: 60mg  x 2 doses Reference number:  Approval from: 12/23/22 to 03/23/24

## 2023-04-20 NOTE — Telephone Encounter (Signed)
 [facility] at [phone] (office of ordering provider Dr. Creola Corn MD) and spoke with Delice Bison in Medical Records. Delice Bison confirmed that patient had labwork done on 05/10/2022 and had a calcium value of 9.0 (normal). Confirmed via readback. Delice Bison stated she would also fax the labwork values to [facility].  Wyvonne Lenz, RN

## 2023-04-21 ENCOUNTER — Ambulatory Visit: Payer: Medicare Other

## 2023-04-21 VITALS — BP 147/73 | HR 76 | Temp 97.7°F | Resp 18 | Ht 61.0 in | Wt 140.6 lb

## 2023-04-21 DIAGNOSIS — M810 Age-related osteoporosis without current pathological fracture: Secondary | ICD-10-CM | POA: Diagnosis not present

## 2023-04-21 MED ORDER — DENOSUMAB 60 MG/ML ~~LOC~~ SOSY
60.0000 mg | PREFILLED_SYRINGE | Freq: Once | SUBCUTANEOUS | Status: AC
  Administered 2023-04-21: 60 mg via SUBCUTANEOUS
  Filled 2023-04-21: qty 1

## 2023-04-21 NOTE — Progress Notes (Signed)
 Diagnosis: Osteoporosis  Provider:  Chilton Greathouse MD  Procedure: Injection  Prolia (Denosumab), Dose: 60 mg, Site: subcutaneous, Number of injections: 1  Injection Site(s): Right arm  Post Care: Patient declined observation  Discharge: Condition: Good, Destination: Home . AVS Provided  Performed by:  Adriana Mccallum, RN

## 2023-05-19 DIAGNOSIS — R739 Hyperglycemia, unspecified: Secondary | ICD-10-CM | POA: Diagnosis not present

## 2023-05-19 DIAGNOSIS — D649 Anemia, unspecified: Secondary | ICD-10-CM | POA: Diagnosis not present

## 2023-05-19 DIAGNOSIS — R030 Elevated blood-pressure reading, without diagnosis of hypertension: Secondary | ICD-10-CM | POA: Diagnosis not present

## 2023-05-19 DIAGNOSIS — E785 Hyperlipidemia, unspecified: Secondary | ICD-10-CM | POA: Diagnosis not present

## 2023-05-19 LAB — LAB REPORT - SCANNED: EGFR (Non-African Amer.): 59.5

## 2023-06-01 DIAGNOSIS — Z1331 Encounter for screening for depression: Secondary | ICD-10-CM | POA: Diagnosis not present

## 2023-06-01 DIAGNOSIS — Z1339 Encounter for screening examination for other mental health and behavioral disorders: Secondary | ICD-10-CM | POA: Diagnosis not present

## 2023-06-01 DIAGNOSIS — E785 Hyperlipidemia, unspecified: Secondary | ICD-10-CM | POA: Diagnosis not present

## 2023-06-01 DIAGNOSIS — R030 Elevated blood-pressure reading, without diagnosis of hypertension: Secondary | ICD-10-CM | POA: Diagnosis not present

## 2023-06-01 DIAGNOSIS — Z0000 Encounter for general adult medical examination without abnormal findings: Secondary | ICD-10-CM | POA: Diagnosis not present

## 2023-06-01 DIAGNOSIS — H9190 Unspecified hearing loss, unspecified ear: Secondary | ICD-10-CM | POA: Diagnosis not present

## 2023-06-01 DIAGNOSIS — C50919 Malignant neoplasm of unspecified site of unspecified female breast: Secondary | ICD-10-CM | POA: Diagnosis not present

## 2023-06-01 DIAGNOSIS — M1990 Unspecified osteoarthritis, unspecified site: Secondary | ICD-10-CM | POA: Diagnosis not present

## 2023-06-01 DIAGNOSIS — M810 Age-related osteoporosis without current pathological fracture: Secondary | ICD-10-CM | POA: Diagnosis not present

## 2023-06-01 DIAGNOSIS — R739 Hyperglycemia, unspecified: Secondary | ICD-10-CM | POA: Diagnosis not present

## 2023-06-01 DIAGNOSIS — I340 Nonrheumatic mitral (valve) insufficiency: Secondary | ICD-10-CM | POA: Diagnosis not present

## 2023-06-01 DIAGNOSIS — R82998 Other abnormal findings in urine: Secondary | ICD-10-CM | POA: Diagnosis not present

## 2023-08-31 ENCOUNTER — Telehealth: Payer: Self-pay | Admitting: Hematology and Oncology

## 2023-08-31 NOTE — Telephone Encounter (Signed)
 Rescheule appointment per provider pal.  Called left VM with changes made to the upcoming appointment.

## 2023-10-20 ENCOUNTER — Ambulatory Visit: Payer: Medicare Other

## 2023-10-20 VITALS — BP 150/79 | HR 74 | Temp 97.8°F | Resp 16 | Ht 62.0 in | Wt 139.8 lb

## 2023-10-20 DIAGNOSIS — M810 Age-related osteoporosis without current pathological fracture: Secondary | ICD-10-CM | POA: Diagnosis not present

## 2023-10-20 MED ORDER — DENOSUMAB 60 MG/ML ~~LOC~~ SOSY
60.0000 mg | PREFILLED_SYRINGE | Freq: Once | SUBCUTANEOUS | Status: AC
  Administered 2023-10-20: 60 mg via SUBCUTANEOUS

## 2023-10-20 NOTE — Progress Notes (Signed)
 Diagnosis: Osteoporosis  Provider:  Mannam, Praveen MD  Procedure: Injection  Prolia  (Denosumab ), Dose: 60 mg, Site: subcutaneous, Number of injections: 1  Injection Site(s): Left arm  Post Care: Patient declined observation  Discharge: Condition: Good, Destination: Home . AVS Provided  Performed by:  Eleanor DELENA Bloch, RN

## 2023-11-09 ENCOUNTER — Ambulatory Visit
Admission: RE | Admit: 2023-11-09 | Discharge: 2023-11-09 | Disposition: A | Discharge status: Home / Self Care | Source: Ambulatory Visit | Attending: Hematology and Oncology | Admitting: Hematology and Oncology

## 2023-11-09 ENCOUNTER — Encounter: Payer: Self-pay | Admitting: Pulmonary Disease

## 2023-11-09 DIAGNOSIS — Z1231 Encounter for screening mammogram for malignant neoplasm of breast: Secondary | ICD-10-CM | POA: Diagnosis not present

## 2023-11-09 DIAGNOSIS — Z170 Estrogen receptor positive status [ER+]: Secondary | ICD-10-CM

## 2023-12-15 ENCOUNTER — Ambulatory Visit: Payer: Medicare Other | Admitting: Hematology and Oncology

## 2023-12-18 ENCOUNTER — Inpatient Hospital Stay: Attending: Hematology and Oncology | Admitting: Hematology and Oncology

## 2023-12-18 VITALS — BP 140/66 | HR 78 | Temp 97.4°F | Resp 16 | Wt 140.0 lb

## 2023-12-18 DIAGNOSIS — Z1231 Encounter for screening mammogram for malignant neoplasm of breast: Secondary | ICD-10-CM | POA: Diagnosis not present

## 2023-12-18 DIAGNOSIS — Z170 Estrogen receptor positive status [ER+]: Secondary | ICD-10-CM

## 2023-12-18 DIAGNOSIS — C50412 Malignant neoplasm of upper-outer quadrant of left female breast: Secondary | ICD-10-CM | POA: Diagnosis not present

## 2023-12-18 DIAGNOSIS — Z853 Personal history of malignant neoplasm of breast: Secondary | ICD-10-CM | POA: Insufficient documentation

## 2023-12-18 NOTE — Progress Notes (Signed)
 [facility]  Telephone:([phone] Fax:(336) [phone]     ID: Holly Leon DOB: 02-27-1935  MR#: [id number]  RDW#:[id number]  Patient Care Team: Onita Rush, MD as PCP - General (Internal Medicine) Curvin Deward MOULD, MD as Consulting Physician (General Surgery) Dewey Rush, MD as Consulting Physician (Radiation Oncology) Tyree Nanetta SAILOR, RN as Oncology Nurse Navigator Rolan Ezra RAMAN, MD as Consulting Physician (Cardiology) Loretha Ash, MD as Medical Oncologist (Hematology and Oncology) Ash Loretha, MD OTHER MD:  CHIEF COMPLAINT: HER-2 positive estrogen receptor positive breast cancer  CURRENT TREATMENT: Observation  INTERVAL HISTORY:  [name] returns today for follow up of her Her2 positive breast cancer.  She is now under observation as she declined antiestrogens.  Since her last visit, she has been doing very well. No new complaints at all. Last mammogram in Sep 2025, neg. Rest of the pertinent 10 point ROS reviewed and negative  COVID 19 VACCINATION STATUS: refuses vaccination; has not had COVID as of October 86.   HISTORY OF CURRENT ILLNESS: From the original intake note:  Holly Leon had routine screening mammography on 09/30/2019 showing a possible abnormality in the left breast. She underwent left diagnostic mammography with tomography and left breast ultrasonography at [facility] on 10/18/2019 showing: breast density category B; indeterminate 9 mm left breast mass at 1:30; no axillary adenopathy.  Accordingly on 10/31/2019 she proceeded to biopsy of the left breast area in question. The pathology from this procedure ([id number]) showed: fibrocystic changes. On post-biopsy mammogram, the biopsy clip was found to have migrated approximately 3.6 cm medially. She returned for repeat biopsy on 11/11/2019, with pathology ([id number]) showing: invasive mammary carcinoma, grade 3, e-cadherin positive. Prognostic indicators significant for:  estrogen receptor, 80% positive with moderate staining intensity and progesterone receptor, 0% negative. Proliferation marker Ki67 at 70%. HER2 equivocal by immunohistochemistry (2+), but positive by fluorescent in situ hybridization with a signals ratio 2.83 and number per cell 5.65.  [name]   PAST MEDICAL HISTORY: Past Medical History:  Diagnosis Date   History of left breast cancer    Hyperlipidemia    Osteoporosis    Postural dizziness with presyncope     PAST SURGICAL HISTORY: Past Surgical History:  Procedure Laterality Date   BREAST LUMPECTOMY WITH RADIOACTIVE SEED LOCALIZATION Left 01/22/2020   Procedure: LEFT BREAST LUMPECTOMY WITH RADIOACTIVE SEED LOCALIZATION;  Surgeon: Curvin Deward MOULD, MD;  Location: [facility];  Service: General;  Laterality: Left;   TONSILLECTOMY     TUBAL LIGATION      FAMILY HISTORY: Family History  Problem Relation Age of Onset   Colon cancer Neg Hx    Esophageal cancer Neg Hx    Rectal cancer Neg Hx    Stomach cancer Neg Hx    Breast cancer Neg Hx   The patient's father died at the age of 12 from heart disease. The patient's mother died at the age of 9 from heart disease. The patient has 1 brother who died at age 66 from heart disease. There have been some cancers on both sides of the family but the patient really does not have information regarding what type of cancers they were or when the diagnoses were made. She will try to get that information for us    GYNECOLOGIC HISTORY:  No LMP recorded. Patient is postmenopausal. Menarche: 88 years old Age at first live birth: 88 years old GX P 2 LMP early 11s Contraceptive early for several years without  complications HRT no Hysterectomy? no BSO? no   SOCIAL HISTORY: (updated 11/2019)  [name] is widowed and lives by herself with no pets. Her husband was in the [employer]. 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 Her son Holly Leon lives in [place] and her son Holly Leon lives in [place]. They both work in [employer]. The patient has no grandchildren. She attends [employer]    ADVANCED DIRECTIVES: Not in place. At the 86/19/2021 visit the patient was given the appropriate documents to complete and notarized at her discretion. She is planning to name her son Holly Leon as her healthcare power of attorney   HEALTH MAINTENANCE: Social History   Tobacco Use   Smoking status: Never   Smokeless tobacco: Never  Vaping Use   Vaping status: Never Used  Substance Use Topics   Alcohol  use: No   Drug use: No     Colonoscopy: 12/2016  PAP: none on file  Bone density: none on file   Allergies  Allergen Reactions   Lipitor [Atorvastatin] Hives    Current Outpatient Medications  Medication Sig Dispense Refill   aspirin 81 MG chewable tablet Chew by mouth daily.     calcium citrate-vitamin D (CITRACAL+D) 315-200 MG-UNIT tablet Take 1 tablet by mouth 2 (two) times daily.     clotrimazole -betamethasone  (LOTRISONE ) lotion Apply topically 2 (two) times daily. 90 mL 2   Multiple Vitamins-Minerals (CENTRUM ADULTS PO) Take by mouth.     simvastatin (ZOCOR) 40 MG tablet Take 40 mg by mouth daily.     vitamin B-12 (CYANOCOBALAMIN) 500 MCG tablet Take 500 mcg by mouth daily.     vitamin C (ASCORBIC ACID) 500 MG tablet Take 500 mg by mouth daily.     vitamin E 400 UNIT capsule Take 400 Units by mouth daily.     No current facility-administered medications for this visit.    OBJECTIVE: White woman in no acute distress  Vitals:   12/18/23 0903  BP: (!) 140/66  Pulse: 78  Resp: 16  Temp: (!) 97.4 F (36.3 C)  SpO2: 97%       Body mass index is 25.61 kg/m.   Wt Readings from Last 3 Encounters:  12/18/23 140 lb (63.5 kg)  10/20/23 139 lb 12.8 oz (63.4 kg)  04/21/23 140 lb 9.6 oz (63.8 kg)      ECOG FS:1 - Symptomatic but completely  ambulatory   Physical Exam Constitutional:      Appearance: Normal appearance.  Chest:     Comments: Bilateral breasts inspected.  No palpable masses or regional adenopathy Abdominal:     Palpations: Abdomen is soft.  Musculoskeletal:        General: No swelling or tenderness.     Cervical back: Normal range of motion and neck supple. No rigidity.  Lymphadenopathy:     Cervical: No cervical adenopathy.  Skin:    General: Skin is warm and dry.  Neurological:     General: No focal deficit present.     Mental Status: She is alert.     LAB RESULTS:  CMP     Component Value Date/Time   NA 140 12/14/2021 1405   K 3.7 12/14/2021 1405   CL 106 12/14/2021 1405   CO2 27 12/14/2021 1405   GLUCOSE 106 (H) 12/14/2021 1405   BUN 15 12/14/2021 1405   CREATININE 0.89 12/14/2021 1405   CALCIUM 9.6 12/14/2021 1405   PROT 8.0  12/14/2021 1405   ALBUMIN 4.3 12/14/2021 1405   AST 18 12/14/2021 1405   ALT 17 12/14/2021 1405   ALKPHOS 66 12/14/2021 1405   BILITOT 0.5 12/14/2021 1405   GFRNONAA 59.5 05/19/2023 0925   GFRAA  03/24/2008 1642    >60        The eGFR has been calculated using the MDRD equation. This calculation has not been validated in all clinical situations. eGFR's persistently <60 mL/min signify possible Chronic Kidney Disease.    No results found for: TOTALPROTELP, ALBUMINELP, A1GS, A2GS, BETS, BETA2SER, GAMS, MSPIKE, SPEI  Lab Results  Component Value Date   WBC 5.1 12/14/2021   NEUTROABS 3.0 12/14/2021   HGB 14.0 12/14/2021   HCT 41.3 12/14/2021   MCV 90.6 12/14/2021   PLT 267 12/14/2021    No results found for: LABCA2  No components found for: [id number]  No results for input(s): INR in the last 168 hours.  No results found for: LABCA2  No results found for: RJW800  No results found for: CAN125  No results found for: CAN153  No results found for: CA2729  No components found for: HGQUANT  No results  found for: CEA1, CEA / No results found for: CEA1, CEA   No results found for: AFPTUMOR  No results found for: CHROMOGRNA  No results found for: KPAFRELGTCHN, LAMBDASER, KAPLAMBRATIO (kappa/lambda light chains)  No results found for: HGBA, HGBA2QUANT, HGBFQUANT, HGBSQUAN (Hemoglobinopathy evaluation)   No results found for: LDH  No results found for: IRON, TIBC, IRONPCTSAT (Iron and TIBC)  No results found for: FERRITIN  Urinalysis No results found for: COLORURINE, APPEARANCEUR, LABSPEC, PHURINE, GLUCOSEU, HGBUR, BILIRUBINUR, KETONESUR, PROTEINUR, UROBILINOGEN, NITRITE, LEUKOCYTESUR   STUDIES: No results found.   ELIGIBLE FOR AVAILABLE RESEARCH PROTOCOL: no   ASSESSMENT: 88 y.o. [place] woman status post left breast upper outer quadrant biopsy 11/11/2019 for a clinical T1b N0, stage IA invasive ductal carcinoma, grade 3, estrogen receptor positive, progesterone receptor negative, HER-2 amplified, with an MIB-1 of 70%  (1) s/p left lumpectomy w/o sentinel node sampling 01/22/2020 showed a pT1b cN0, stage IB invasive ductal carcinoma, grade 3, with negative margins  (2) anti-HER-2 immunotherapy starting 02/04/2020, completing 6 months 09/01/2020  (a) echo 01/02/2020 shows an EF of 70-75%  (B) echo 04/09/2020 shows an ejection fraction in the 65-70% range  (C) echo 08/27/2020 shows a hyperdynamic LV with an ejection fraction greater than 75%  (3) adjuvant radiation 03/10/2020 through 04/06/2020 Site Technique Total Dose (Gy) Dose per Fx (Gy) Completed Fx Beam Energies  Breast, Left: Breast_Lt 3D 42.56/42.56 2.66 16/16 6X  Breast, Left: Breast_Lt_Bst specialPort 8/8 2 4/4 12E, 15E   (4) antiestrogens discussed 04/07/2020, patient opted against   PLAN:  Assessment and Plan Assessment & Plan Breast cancer, surveillance follow-up Four years post-diagnosis, no recurrence or new symptoms.  - Latest mammogram in  September normal. - No concerns on exam today - Continue annual mammograms, next in September. - Schedule follow-up in one year. - Discussed extending follow-up visits up to ten years post-diagnosis.  Time spent: 20 min  *Total Encounter Time as defined by the Centers for Medicare and Medicaid Services includes, in addition to the face-to-face time of a patient visit (documented in the note above) non-face-to-face time: obtaining and reviewing outside history, ordering and reviewing medications, tests or procedures, care coordination (communications with other health care professionals or caregivers) and documentation in the medical record.

## 2024-03-07 ENCOUNTER — Telehealth (HOSPITAL_COMMUNITY): Payer: Self-pay | Admitting: Pharmacist

## 2024-03-07 NOTE — Telephone Encounter (Signed)
 Patient aware of site of care change for Prolia  to [facility]  Last dose: 10/20/2023 Next dose: 04/23/2023  Need updated orders (last from July 2023). Faxed order form to Dr. Monia office requesting order, labs, clinicals.  Sherry Pennant, PharmD, MPH, BCPS, CPP Clinical Pharmacist

## 2024-04-22 ENCOUNTER — Ambulatory Visit

## 2024-12-17 ENCOUNTER — Inpatient Hospital Stay: Admitting: Hematology and Oncology
